# Patient Record
Sex: Female | Born: 1965 | Race: White | Hispanic: No | Marital: Single | Smoking: Former smoker
Health system: Southern US, Community
[De-identification: ages and names within clinical notes are randomized; demographics above are authoritative.]

## PROBLEM LIST (undated history)

## (undated) DIAGNOSIS — F329 Major depressive disorder, single episode, unspecified: Secondary | ICD-10-CM

## (undated) DIAGNOSIS — F32A Depression, unspecified: Secondary | ICD-10-CM

## (undated) DIAGNOSIS — Z720 Tobacco use: Secondary | ICD-10-CM

## (undated) DIAGNOSIS — H1130 Conjunctival hemorrhage, unspecified eye: Secondary | ICD-10-CM

## (undated) DIAGNOSIS — E785 Hyperlipidemia, unspecified: Secondary | ICD-10-CM

## (undated) DIAGNOSIS — R42 Dizziness and giddiness: Secondary | ICD-10-CM

## (undated) DIAGNOSIS — D649 Anemia, unspecified: Secondary | ICD-10-CM

## (undated) DIAGNOSIS — N3281 Overactive bladder: Secondary | ICD-10-CM

## (undated) HISTORY — DX: Hyperlipidemia, unspecified: E78.5

## (undated) HISTORY — DX: Dizziness and giddiness: R42

## (undated) HISTORY — PX: TONSILLECTOMY AND ADENOIDECTOMY: SHX28

## (undated) HISTORY — DX: Overactive bladder: N32.81

## (undated) HISTORY — DX: Anemia, unspecified: D64.9

## (undated) HISTORY — DX: Conjunctival hemorrhage, unspecified eye: H11.30

## (undated) HISTORY — DX: Tobacco use: Z72.0

---

## 1998-12-06 ENCOUNTER — Other Ambulatory Visit: Admission: RE | Admit: 1998-12-06 | Discharge: 1998-12-06 | Payer: Self-pay | Admitting: *Deleted

## 1999-12-26 ENCOUNTER — Other Ambulatory Visit: Admission: RE | Admit: 1999-12-26 | Discharge: 1999-12-26 | Payer: Self-pay | Admitting: *Deleted

## 2001-04-27 ENCOUNTER — Other Ambulatory Visit: Admission: RE | Admit: 2001-04-27 | Discharge: 2001-04-27 | Payer: Self-pay | Admitting: *Deleted

## 2001-05-19 ENCOUNTER — Encounter: Admission: RE | Admit: 2001-05-19 | Discharge: 2001-08-17 | Payer: Self-pay | Admitting: *Deleted

## 2002-05-06 ENCOUNTER — Other Ambulatory Visit: Admission: RE | Admit: 2002-05-06 | Discharge: 2002-05-06 | Payer: Self-pay | Admitting: *Deleted

## 2004-08-04 ENCOUNTER — Emergency Department (HOSPITAL_COMMUNITY): Admission: EM | Admit: 2004-08-04 | Discharge: 2004-08-04 | Payer: Self-pay | Admitting: Emergency Medicine

## 2004-08-05 ENCOUNTER — Emergency Department (HOSPITAL_COMMUNITY): Admission: EM | Admit: 2004-08-05 | Discharge: 2004-08-05 | Payer: Self-pay | Admitting: Emergency Medicine

## 2005-03-08 ENCOUNTER — Ambulatory Visit (HOSPITAL_COMMUNITY): Admission: RE | Admit: 2005-03-08 | Discharge: 2005-03-08 | Payer: Self-pay | Admitting: Family Medicine

## 2005-08-08 ENCOUNTER — Other Ambulatory Visit: Admission: RE | Admit: 2005-08-08 | Discharge: 2005-08-08 | Payer: Self-pay | Admitting: Family Medicine

## 2005-08-22 ENCOUNTER — Ambulatory Visit (HOSPITAL_COMMUNITY): Admission: RE | Admit: 2005-08-22 | Discharge: 2005-08-22 | Payer: Self-pay | Admitting: Family Medicine

## 2007-01-07 ENCOUNTER — Emergency Department (HOSPITAL_COMMUNITY): Admission: EM | Admit: 2007-01-07 | Discharge: 2007-01-07 | Payer: Self-pay | Admitting: *Deleted

## 2008-02-02 ENCOUNTER — Ambulatory Visit (HOSPITAL_COMMUNITY): Admission: RE | Admit: 2008-02-02 | Discharge: 2008-02-02 | Payer: Self-pay | Admitting: Family Medicine

## 2009-08-03 ENCOUNTER — Ambulatory Visit (HOSPITAL_COMMUNITY): Admission: RE | Admit: 2009-08-03 | Discharge: 2009-08-03 | Payer: Self-pay | Admitting: Family Medicine

## 2010-02-12 ENCOUNTER — Encounter: Admission: RE | Admit: 2010-02-12 | Discharge: 2010-02-12 | Payer: Self-pay | Admitting: Family Medicine

## 2010-06-09 ENCOUNTER — Emergency Department (HOSPITAL_COMMUNITY): Admission: EM | Admit: 2010-06-09 | Discharge: 2010-06-09 | Payer: Self-pay | Admitting: Emergency Medicine

## 2010-11-18 ENCOUNTER — Encounter: Payer: Self-pay | Admitting: Family Medicine

## 2011-01-11 LAB — GLUCOSE, CAPILLARY: Glucose-Capillary: 158 mg/dL — ABNORMAL HIGH (ref 70–99)

## 2011-04-01 ENCOUNTER — Ambulatory Visit: Payer: Self-pay

## 2011-05-29 DIAGNOSIS — R12 Heartburn: Secondary | ICD-10-CM | POA: Insufficient documentation

## 2011-05-29 DIAGNOSIS — N318 Other neuromuscular dysfunction of bladder: Secondary | ICD-10-CM | POA: Insufficient documentation

## 2011-05-29 DIAGNOSIS — E7800 Pure hypercholesterolemia, unspecified: Secondary | ICD-10-CM | POA: Insufficient documentation

## 2011-05-29 DIAGNOSIS — R5381 Other malaise: Secondary | ICD-10-CM | POA: Insufficient documentation

## 2011-09-05 ENCOUNTER — Other Ambulatory Visit (HOSPITAL_COMMUNITY): Payer: Self-pay | Admitting: Internal Medicine

## 2011-09-05 DIAGNOSIS — R110 Nausea: Secondary | ICD-10-CM

## 2011-09-05 DIAGNOSIS — R140 Abdominal distension (gaseous): Secondary | ICD-10-CM

## 2011-09-30 ENCOUNTER — Encounter (HOSPITAL_COMMUNITY)
Admission: RE | Admit: 2011-09-30 | Discharge: 2011-09-30 | Disposition: A | Discharge status: Home / Self Care | Payer: PRIVATE HEALTH INSURANCE | Source: Ambulatory Visit | Attending: Internal Medicine | Admitting: Internal Medicine

## 2011-09-30 DIAGNOSIS — R142 Eructation: Secondary | ICD-10-CM | POA: Insufficient documentation

## 2011-09-30 DIAGNOSIS — R140 Abdominal distension (gaseous): Secondary | ICD-10-CM

## 2011-09-30 DIAGNOSIS — R110 Nausea: Secondary | ICD-10-CM | POA: Insufficient documentation

## 2011-09-30 DIAGNOSIS — R141 Gas pain: Secondary | ICD-10-CM | POA: Insufficient documentation

## 2011-09-30 DIAGNOSIS — R109 Unspecified abdominal pain: Secondary | ICD-10-CM | POA: Insufficient documentation

## 2011-09-30 MED ORDER — TECHNETIUM TC 99M MEBROFENIN IV KIT
5.3000 | PACK | Freq: Once | INTRAVENOUS | Status: AC | PRN
  Administered 2011-09-30: 5 via INTRAVENOUS

## 2011-09-30 MED ORDER — SINCALIDE 5 MCG IJ SOLR: 1.8100 ug | Freq: Once | INTRAMUSCULAR | Status: DC

## 2011-10-17 ENCOUNTER — Encounter: Payer: Self-pay | Admitting: Cardiology

## 2011-10-17 ENCOUNTER — Ambulatory Visit (INDEPENDENT_AMBULATORY_CARE_PROVIDER_SITE_OTHER): Payer: PRIVATE HEALTH INSURANCE | Admitting: Cardiology

## 2011-10-17 ENCOUNTER — Encounter: Payer: Self-pay | Admitting: *Deleted

## 2011-10-17 VITALS — BP 110/80 | HR 76 | Ht 62.0 in | Wt 195.0 lb

## 2011-10-17 DIAGNOSIS — E119 Type 2 diabetes mellitus without complications: Secondary | ICD-10-CM

## 2011-10-17 DIAGNOSIS — R42 Dizziness and giddiness: Secondary | ICD-10-CM

## 2011-10-17 DIAGNOSIS — R0989 Other specified symptoms and signs involving the circulatory and respiratory systems: Secondary | ICD-10-CM

## 2011-10-17 DIAGNOSIS — Z720 Tobacco use: Secondary | ICD-10-CM

## 2011-10-17 DIAGNOSIS — F17200 Nicotine dependence, unspecified, uncomplicated: Secondary | ICD-10-CM

## 2011-10-17 DIAGNOSIS — R0600 Dyspnea, unspecified: Secondary | ICD-10-CM

## 2011-10-17 DIAGNOSIS — R0789 Other chest pain: Secondary | ICD-10-CM

## 2011-10-17 DIAGNOSIS — E785 Hyperlipidemia, unspecified: Secondary | ICD-10-CM

## 2011-10-17 NOTE — Patient Instructions (Signed)
We will schedule you for a stress Echo  We will have you wear an event monitor.

## 2011-10-18 DIAGNOSIS — R42 Dizziness and giddiness: Secondary | ICD-10-CM | POA: Insufficient documentation

## 2011-10-18 DIAGNOSIS — E785 Hyperlipidemia, unspecified: Secondary | ICD-10-CM | POA: Insufficient documentation

## 2011-10-18 DIAGNOSIS — R0789 Other chest pain: Secondary | ICD-10-CM | POA: Insufficient documentation

## 2011-10-18 DIAGNOSIS — E119 Type 2 diabetes mellitus without complications: Secondary | ICD-10-CM | POA: Insufficient documentation

## 2011-10-18 DIAGNOSIS — Z720 Tobacco use: Secondary | ICD-10-CM | POA: Insufficient documentation

## 2011-10-18 NOTE — Progress Notes (Signed)
Michelle Rasmussen Date of Birth: [birth year] Medical Record #[id number]  History of Present Illness: Michelle Rasmussen is seen at the request of Loree Fee PA-C for cardiac evaluation. She is a 45 year old white female who has a history of hypertension, hyperlipidemia, tobacco abuse, and family history of coronary disease. She has been experiencing a number of unusual symptoms. She complains that she has a lot of gas in her chest that radiates up into her neck and into her temples. She feels sick on her stomach a lot. Over the past 2 years she's been experiencing dizzy spells. These spells only happen at work. 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 Sometimes this progresses to the point where her head is actually jerking and she feels as though she is trembling all over. On a couple of occasions she felt she might pass out. She has had no increase in shortness of breath. She has no true chest pain. She has had an abdominal ultrasound which showed normal gallbladder anatomy.  No current outpatient prescriptions on file prior to visit.    Allergies  Allergen Reactions  . Bupropion     Past Medical History  Diagnosis Date  . Diabetes mellitus   . Hyperlipidemia   . OAB (overactive bladder)   . 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 Dizzy spells     x2 years  . Tobacco abuse     Past Surgical History  Procedure Date  . Tonsillectomy and adenoidectomy     History  Smoking status  . Current Everyday Smoker -- 1.0 packs/day for 22 years  . Types: Cigarettes  Smokeless tobacco  . Not on file    History  Alcohol Use No    Family History  Problem Relation Age of Onset  . COPD Mother   . Hypertension Mother   . Diabetes Mother   . Schizophrenia Mother   . Heart attack Father   . ALS Father   . Coronary artery disease Father     with CABG x7    Review of  Systems: The review of systems is positive for occasional episodes of burning discomfort down both legs from the hips down. She complains of blurred vision. She notes that her memory is getting worse.  All other systems were reviewed and are negative.  Physical Exam: BP 110/80  Pulse 76  Ht 5\' 2"  (1.575 m)  Wt 195 lb (88.451 kg)  BMI 35.67 kg/m2  LMP 09/30/2011 She is an obese white female in no acute distress.The patient is alert and oriented x 3.  The mood and affect are normal.  The skin is warm and dry.  Color is normal.  The HEENT exam reveals that the sclera are nonicteric.  The mucous membranes are moist.  The carotids are 2+ without bruits.  There is no thyromegaly.  There is no JVD.  The lungs are clear.  The chest wall is non tender.  The heart exam reveals a regular rate with a normal S1 and S2.  There are no murmurs, gallops, or rubs.  The PMI is not displaced.   Abdominal exam reveals good bowel sounds.  There is no guarding or rebound.  There is no hepatosplenomegaly or tenderness.  There are no masses.  Exam of the legs reveal no clubbing, cyanosis, or edema.  The legs are without rashes.  The distal pulses are intact.  Cranial nerves II - XII are intact.  Motor and sensory functions are intact.  The gait is normal.  LABORATORY DATA: ECG today is normal. Recent blood work revealed a hemoglobin of 10.8. Glucose is 234. Other chemistries were normal. TSH was normal. A1c was 7.4%. Total cholesterol 137, triglycerides 303, HDL 28, LDL 48.  Assessment / Plan:

## 2011-10-18 NOTE — Assessment & Plan Note (Signed)
Symptoms are atypical for cardiac arrhythmia or vascular etiology. Her symptoms are more positional when she raises her arms above her head. We will have her wear an event monitor to rule out any significant arrhythmia.

## 2011-10-18 NOTE — Assessment & Plan Note (Signed)
Patient was counseled on the importance of smoking cessation. She's not ready to quit at this time.

## 2011-10-18 NOTE — Assessment & Plan Note (Signed)
Review of systems is diffusely positive. Her symptoms are atypical for cardiac disease but she does have multiple cardiac risk factors including history of diabetes, hyperlipidemia, tobacco abuse, and family history of early coronary disease. I would recommend a stress echo to further stratify her cardiac risk. If this study and her event monitor on unremarkable and I think we can safely say that her symptoms were not cardiac related.

## 2011-10-31 ENCOUNTER — Telehealth: Payer: Self-pay | Admitting: Cardiology

## 2011-10-31 NOTE — Telephone Encounter (Signed)
LOV,12 lead faxed to [facility] @ [phone] 10/30/10/KM

## 2011-11-01 ENCOUNTER — Other Ambulatory Visit (HOSPITAL_COMMUNITY): Payer: Self-pay | Admitting: Cardiology

## 2011-11-01 ENCOUNTER — Ambulatory Visit (HOSPITAL_BASED_OUTPATIENT_CLINIC_OR_DEPARTMENT_OTHER): Payer: PRIVATE HEALTH INSURANCE | Admitting: Radiology

## 2011-11-01 ENCOUNTER — Ambulatory Visit (HOSPITAL_COMMUNITY): Payer: PRIVATE HEALTH INSURANCE | Attending: Internal Medicine | Admitting: Radiology

## 2011-11-01 DIAGNOSIS — I10 Essential (primary) hypertension: Secondary | ICD-10-CM | POA: Insufficient documentation

## 2011-11-01 DIAGNOSIS — R0600 Dyspnea, unspecified: Secondary | ICD-10-CM

## 2011-11-01 DIAGNOSIS — E785 Hyperlipidemia, unspecified: Secondary | ICD-10-CM | POA: Insufficient documentation

## 2011-11-01 DIAGNOSIS — R55 Syncope and collapse: Secondary | ICD-10-CM | POA: Insufficient documentation

## 2011-11-01 DIAGNOSIS — Z8249 Family history of ischemic heart disease and other diseases of the circulatory system: Secondary | ICD-10-CM | POA: Insufficient documentation

## 2011-11-01 DIAGNOSIS — R0989 Other specified symptoms and signs involving the circulatory and respiratory systems: Secondary | ICD-10-CM

## 2011-11-01 DIAGNOSIS — R072 Precordial pain: Secondary | ICD-10-CM | POA: Insufficient documentation

## 2011-11-01 DIAGNOSIS — F17200 Nicotine dependence, unspecified, uncomplicated: Secondary | ICD-10-CM | POA: Insufficient documentation

## 2011-11-01 MED ORDER — PERFLUTREN PROTEIN A MICROSPH IV SUSP
2.5000 mL | Freq: Once | INTRAVENOUS | Status: AC
  Administered 2011-11-01: 2.5 mL via INTRAVENOUS

## 2012-02-21 ENCOUNTER — Encounter: Payer: Self-pay | Admitting: Cardiology

## 2012-04-02 ENCOUNTER — Ambulatory Visit (HOSPITAL_COMMUNITY)
Admission: RE | Admit: 2012-04-02 | Discharge: 2012-04-02 | Disposition: A | Discharge status: Home / Self Care | Payer: PRIVATE HEALTH INSURANCE | Source: Ambulatory Visit | Attending: Internal Medicine | Admitting: Internal Medicine

## 2012-04-02 ENCOUNTER — Other Ambulatory Visit (HOSPITAL_COMMUNITY): Payer: Self-pay | Admitting: Internal Medicine

## 2012-04-02 DIAGNOSIS — R059 Cough, unspecified: Secondary | ICD-10-CM | POA: Insufficient documentation

## 2012-04-02 DIAGNOSIS — R5381 Other malaise: Secondary | ICD-10-CM | POA: Insufficient documentation

## 2012-04-02 DIAGNOSIS — R05 Cough: Secondary | ICD-10-CM | POA: Insufficient documentation

## 2012-04-14 ENCOUNTER — Other Ambulatory Visit: Payer: Self-pay | Admitting: Internal Medicine

## 2012-04-14 DIAGNOSIS — R109 Unspecified abdominal pain: Secondary | ICD-10-CM

## 2012-04-15 ENCOUNTER — Encounter: Payer: Self-pay | Admitting: Gastroenterology

## 2012-04-15 ENCOUNTER — Ambulatory Visit (INDEPENDENT_AMBULATORY_CARE_PROVIDER_SITE_OTHER): Payer: PRIVATE HEALTH INSURANCE | Admitting: Gastroenterology

## 2012-04-15 ENCOUNTER — Other Ambulatory Visit: Payer: PRIVATE HEALTH INSURANCE

## 2012-04-15 ENCOUNTER — Ambulatory Visit
Admission: RE | Admit: 2012-04-15 | Discharge: 2012-04-15 | Disposition: A | Discharge status: Home / Self Care | Payer: PRIVATE HEALTH INSURANCE | Source: Ambulatory Visit | Attending: Internal Medicine | Admitting: Internal Medicine

## 2012-04-15 VITALS — BP 120/78 | HR 92 | Ht 62.0 in | Wt 186.0 lb

## 2012-04-15 DIAGNOSIS — R197 Diarrhea, unspecified: Secondary | ICD-10-CM

## 2012-04-15 DIAGNOSIS — D509 Iron deficiency anemia, unspecified: Secondary | ICD-10-CM

## 2012-04-15 DIAGNOSIS — K219 Gastro-esophageal reflux disease without esophagitis: Secondary | ICD-10-CM

## 2012-04-15 DIAGNOSIS — R109 Unspecified abdominal pain: Secondary | ICD-10-CM

## 2012-04-15 DIAGNOSIS — R110 Nausea: Secondary | ICD-10-CM

## 2012-04-15 DIAGNOSIS — R112 Nausea with vomiting, unspecified: Secondary | ICD-10-CM

## 2012-04-15 MED ORDER — IOHEXOL 300 MG/ML  SOLN
100.0000 mL | Freq: Once | INTRAMUSCULAR | Status: AC | PRN
  Administered 2012-04-15: 100 mL via INTRAVENOUS

## 2012-04-15 MED ORDER — MOVIPREP 100 G PO SOLR: 1.0000 | Freq: Once | ORAL | Status: DC

## 2012-04-15 MED ORDER — OMEPRAZOLE 40 MG PO CPDR: 40.0000 mg | DELAYED_RELEASE_CAPSULE | Freq: Two times a day (BID) | ORAL | Status: DC

## 2012-04-15 NOTE — Patient Instructions (Addendum)
Your physician has requested that you go to the basement for the following lab work before leaving today:Celiac Panel. You have been scheduled for an endoscopy and colonoscopy with propofol. Please follow the written instructions given to you at your visit today. Please pick up your prep at the pharmacy within the next 1-3 days. Increase your omeprazole to twice daily. A new prescription has been sent to your pharmacy.  Follow instructions on Hemoccult cards and mail them back to [place] when finished. Patient advised to avoid spicy, acidic, citrus, chocolate, mints, fruit and fruit juices.  Limit the intake of caffeine, alcohol and Soda.  Don't exercise too soon after eating.  Don't lie down within 3-4 hours of eating.  Elevate the head of your bed. Fill your Hyoscamine prescription and take as directed. Cc: Lucky Cowboy, MD

## 2012-04-15 NOTE — Progress Notes (Signed)
History of Present Illness: This is a 46 year old female with diarrhea, nausea, vomiting and lower abdominal pain. She has a long history of GERD treated with daily omeprazole. Recently she has had frequent breakthrough symptoms. She has problems with nausea and vomiting about once every month for many years. CCK HIDA done December 2012 was negative. An abdominal pelvic CT scan is scheduled later today. She relates problems with intermittent urgent diarrhea over the past several years but for the past 2-3 months her symptoms have been more frequent and more severe with diarrhea occurring 6-8 times some days. She was recently found to be iron deficient with a hemoglobin of 10.8 and iron saturation 4% and iron therapy was started. Her hemoglobin has increased to 12.4. Denies weight loss, constipation, change in stool caliber, melena, hematochezia, nausea, vomiting, dysphagia, chest pain.  Review of Systems: Pertinent positive and negative review of systems were noted in the above HPI section. All other review of systems were otherwise negative.  Current Medications, Allergies, Past Medical History, Past Surgical History, Family History and Social History were reviewed in [employer] record.  Physical Exam: General: Well developed , well nourished, no acute distress Head: Normocephalic and atraumatic Eyes:  sclerae anicteric, EOMI Ears: Normal auditory acuity Mouth: No deformity or lesions Neck: Supple, no masses or thyromegaly Lungs: Clear throughout to auscultation Heart: Regular rate and rhythm; no murmurs, rubs or bruits Abdomen: Soft, non tender and non distended. No masses, hepatosplenomegaly or hernias noted. Normal Bowel sounds Rectal: deferred to colonoscopy Musculoskeletal: Symmetrical with no gross deformities  Skin: No lesions on visible extremities Pulses:  Normal pulses noted Extremities: No clubbing, cyanosis, edema or deformities noted Neurological: Alert  oriented x 4, grossly nonfocal Cervical Nodes:  No significant cervical adenopathy Inguinal Nodes: No significant inguinal adenopathy Psychological:  Alert and cooperative. Normal mood and affect  Assessment and Recommendations:  1. GERD with frequent breakthrough reflux symptoms and episodic nausea and vomiting. Intensify antireflux measures and increase omeprazole to 40 mg twice daily. Schedule upper endoscopy for further evaluation. The risks, benefits, and alternatives to endoscopy with possible biopsy and possible dilation were discussed with the patient and they consent to proceed.   2. Iron deficiency anemia. Rule out occult gastrointestinal losses. Obtain stool Hemoccults and a celiac panel. Schedule colonoscopy and upper endoscopy as above. The risks, benefits, and alternatives to colonoscopy with possible biopsy and possible polypectomy were discussed with the patient and they consent to proceed.   3. Worsening diarrhea and lower abdominal pain. Celiac panel and Hemoccults as above. Abdominal/pelvic CT scan as scheduled.Colonoscopy as above. She is advised to begin hyoscyamine as prescribed.

## 2012-04-16 ENCOUNTER — Telehealth: Payer: Self-pay

## 2012-04-16 LAB — CELIAC PANEL 10
Endomysial Screen: NEGATIVE
Gliadin IgA: 2.2 U/mL (ref <20)
Gliadin IgG: 19.8 U/mL (ref <20)
IgA: 51 mg/dL — ABNORMAL LOW (ref 69–380)
Tissue Transglutaminase Ab, IgA: 1.5 U/mL (ref <20)

## 2012-04-16 NOTE — Telephone Encounter (Signed)
Spoke with scheduler and asked if she would either leave a message or have nurse call me back regarding a CT scan they faxed to [place] this morning stating that Dr. Russella Dar to f/u with all CT scans. Dr. Russella Dar reviewed the CT scan and his recommendation is that Dr. 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 Informed scheduler that Dr. Russella Dar did not order the CT scan and he would like her Primary Care Physician that ordered the scan to due to referral for her. Scheduler states she will send this message on to the nurse and if they have any questions they will call back.

## 2012-05-08 ENCOUNTER — Other Ambulatory Visit: Payer: PRIVATE HEALTH INSURANCE

## 2012-05-08 DIAGNOSIS — R110 Nausea: Secondary | ICD-10-CM

## 2012-05-08 DIAGNOSIS — R197 Diarrhea, unspecified: Secondary | ICD-10-CM

## 2012-05-08 LAB — HEMOCCULT SLIDES (X 3 CARDS)
Fecal Occult Blood: NEGATIVE
OCCULT 3: NEGATIVE

## 2012-05-26 ENCOUNTER — Telehealth: Payer: Self-pay | Admitting: Gastroenterology

## 2012-05-26 ENCOUNTER — Encounter: Payer: Self-pay | Admitting: Gastroenterology

## 2012-05-26 ENCOUNTER — Ambulatory Visit (AMBULATORY_SURGERY_CENTER): Payer: PRIVATE HEALTH INSURANCE | Admitting: Gastroenterology

## 2012-05-26 ENCOUNTER — Telehealth: Payer: Self-pay

## 2012-05-26 VITALS — BP 121/77 | HR 76 | Temp 98.6°F | Resp 13 | Ht 62.0 in | Wt 186.0 lb

## 2012-05-26 DIAGNOSIS — D126 Benign neoplasm of colon, unspecified: Secondary | ICD-10-CM

## 2012-05-26 DIAGNOSIS — R197 Diarrhea, unspecified: Secondary | ICD-10-CM

## 2012-05-26 DIAGNOSIS — K219 Gastro-esophageal reflux disease without esophagitis: Secondary | ICD-10-CM

## 2012-05-26 DIAGNOSIS — R110 Nausea: Secondary | ICD-10-CM

## 2012-05-26 DIAGNOSIS — D1330 Benign neoplasm of unspecified part of small intestine: Secondary | ICD-10-CM

## 2012-05-26 DIAGNOSIS — R109 Unspecified abdominal pain: Secondary | ICD-10-CM

## 2012-05-26 LAB — GLUCOSE, CAPILLARY
Glucose-Capillary: 142 mg/dL — ABNORMAL HIGH (ref 70–99)
Glucose-Capillary: 129 mg/dL — ABNORMAL HIGH (ref 70–99)

## 2012-05-26 MED ORDER — SODIUM CHLORIDE 0.9 % IV SOLN: 500.0000 mL | INTRAVENOUS | Status: DC

## 2012-05-26 NOTE — Op Note (Signed)
[facility] [address] [place], [place]  [address]  ENDOSCOPY PROCEDURE REPORT PATIENT:  Michelle Rasmussen, Michelle Rasmussen  MR#:  [id number] BIRTHDATE:  04-Feb-1966, 45 yrs. old  GENDER:  female ENDOSCOPIST:  Judie Petit T. Russella Dar, MD, [facility] Referred by:  Lucky Cowboy, M.D. PROCEDURE DATE:  05/26/2012 PROCEDURE:  EGD with biopsy, 43239 ASA CLASS:  Class II INDICATIONS:  GERD, nausea, diarrhea MEDICATIONS:  There was residual sedation effect present from prior procedure, MAC sedation, administered by CRNA, propofol 150 mg IV, glycopyrrolate 0.2 mg IV TOPICAL ANESTHETIC:  none DESCRIPTION OF PROCEDURE:   After the risks benefits and alternatives of the procedure were thoroughly explained, informed consent was obtained.  The LB GIF-H180 [id number] endoscope was introduced through the mouth and advanced to the second portion of the duodenum, without limitations.  The instrument was slowly withdrawn as the mucosa was fully examined. <<PROCEDUREIMAGES>> The esophagus and gastroesophageal junction were completely normal in appearance.  The stomach was entered and closely examined. The pylorus, antrum, angularis, and lesser curvature were well visualized, including a retroflexed view of the cardia and fundus. The stomach wall was normally distensable. The scope passed easily through the pylorus into the duodenum.  The duodenal bulb was normal in appearance, as was the postbulbar duodenum. Random biopsies were obtained and sent to pathology.  Retroflexed views revealed no abnormalities.  The scope was then withdrawn from the patient and the procedure completed.  COMPLICATIONS:  None  ENDOSCOPIC IMPRESSION: 1) Normal EGD  RECOMMENDATIONS: 1) Anti-reflux regimen 2) Await pathology results 3) Continue PPI  Neftaly Swiss T. Russella Dar, MD, [facility]  n. eSIGNED:   Venita Lick. Thomasa Heidler at 05/26/2012 02:39 PM  [name], [phone]

## 2012-05-26 NOTE — Op Note (Signed)
[facility] [address] [place], [place]  [address]  COLONOSCOPY PROCEDURE REPORT  PATIENT:  Michelle Rasmussen, Michelle Rasmussen  MR#:  [id number] BIRTHDATE:  Nov 14, 1965, 45 yrs. old  GENDER:  female ENDOSCOPIST:  Judie Petit T. Russella Dar, MD, [facility] Referred by:  Lucky Cowboy, M.D. PROCEDURE DATE:  05/26/2012 PROCEDURE:  Colonoscopy with biopsy and snare polypectomy ASA CLASS:  Class II INDICATIONS:  1) unexplained diarrhea  2) abdominal pain MEDICATIONS:   MAC sedation, administered by CRNA, propofol (Diprivan) 200 mg IV DESCRIPTION OF PROCEDURE:   After the risks benefits and alternatives of the procedure were thoroughly explained, informed consent was obtained.  Digital rectal exam was performed and revealed no abnormalities.   The LB CF-H180AL [id number] endoscope was introduced through the anus and advanced to the terminal ileum which was intubated for a short distance, without limitations. The quality of the prep was good, using MoviPrep.  The instrument was then slowly withdrawn as the colon was fully examined. <<PROCEDUREIMAGES>> FINDINGS:  A sessile polyp was found in the descending colon. It was 6 mm in size. Polyp was snared without cautery. Retrieval was successful.  Otherwise normal colonoscopy without other polyps, masses, vascular ectasias, or inflammatory changes. Random biopsies were obtained and sent to pathology.  The terminal ileum appeared normal.   Retroflexed views in the rectum revealed no abnormalities.  The time to cecum =  2  minutes. The scope was then withdrawn (time =  11.5  min) from the patient and the procedure completed.  COMPLICATIONS:  None  ENDOSCOPIC IMPRESSION: 1) 6 mm sessile polyp in the descending colon 2) Normal terminal ileum  RECOMMENDATIONS: 1) Await pathology results 2) Repeat Colonoscopy in 5 years if polyp is adenomatous, otherwise 10 years for routine screening. 3) Continue antispasmotics as needed  Jacub Waiters T. Russella Dar, MD,  [facility]  n. eSIGNED:   Venita Lick. Ellisyn Icenhower at 05/26/2012 02:32 PM  [name], [phone]

## 2012-05-26 NOTE — Progress Notes (Signed)
Patient did not experience any of the following events: a burn prior to discharge; a fall within the facility; wrong site/side/patient/procedure/implant event; or a hospital transfer or hospital admission upon discharge from the facility. (G8907) Patient did not have preoperative order for IV antibiotic SSI prophylaxis. (G8918)  

## 2012-05-26 NOTE — Telephone Encounter (Signed)
Drank all of 1st dose of Moviprep yest pm.  Drank 1/2 of Moviprep today.  Plans to keep drinking slowly and hopefully finish.  Advised to also do 2 enemas 30 minutes apart if not cleaned out.

## 2012-05-26 NOTE — Patient Instructions (Addendum)
YOU HAD AN ENDOSCOPIC PROCEDURE TODAY AT THE [facility]: Refer to the procedure report that was given to you for any specific questions about what was found during the examination.  If the procedure report does not answer your questions, please call your gastroenterologist to clarify.  If you requested that your care partner not be given the details of your procedure findings, then the procedure report has been included in a sealed envelope for you to review at your convenience later.  YOU SHOULD EXPECT: Some feelings of bloating in the abdomen. Passage of more gas than usual.  Walking can help get rid of the air that was put into your GI tract during the procedure and reduce the bloating. If you had a lower endoscopy (such as a colonoscopy or flexible sigmoidoscopy) you may notice spotting of blood in your stool or on the toilet paper. If you underwent a bowel prep for your procedure, then you may not have a normal bowel movement for a few days.  DIET: Your first meal following the procedure should be a light meal and then it is ok to progress to your normal diet.  A half-sandwich or bowl of soup is an example of a good first meal.  Heavy or fried foods are harder to digest and may make you feel nauseous or bloated.  Likewise meals heavy in dairy and vegetables can cause extra gas to form and this can also increase the bloating.  Drink plenty of fluids but you should avoid alcoholic beverages for 24 hours.  ACTIVITY: Your care partner should take you home directly after the procedure.  You should plan to take it easy, moving slowly for the rest of the day.  You can resume normal activity the day after the procedure however you should NOT DRIVE or use heavy machinery for 24 hours (because of the sedation medicines used during the test).    SYMPTOMS TO REPORT IMMEDIATELY: A gastroenterologist can be reached at any hour.  During normal business hours, 8:30 AM to 5:00 PM Monday through Friday,  call [phone].  After hours and on weekends, please call the GI answering service at [phone] who will take a message and have the physician on call contact you.   Following lower endoscopy (colonoscopy or flexible sigmoidoscopy):  Excessive amounts of blood in the stool  Significant tenderness or worsening of abdominal pains  Swelling of the abdomen that is new, acute  Fever of 100F or higher  Following upper endoscopy (EGD)  Vomiting of blood or coffee ground material  New chest pain or pain under the shoulder blades  Painful or persistently difficult swallowing  New shortness of breath  Fever of 100F or higher  Black, tarry-looking stools  FOLLOW UP: If any biopsies were taken you will be contacted by phone or by letter within the next 1-3 weeks.  Call your gastroenterologist if you have not heard about the biopsies in 3 weeks.  Our staff will call the home number listed on your records the next business day following your procedure to check on you and address any questions or concerns that you may have at that time regarding the information given to you following your procedure. This is a courtesy call and so if there is no answer at the home number and we have not heard from you through the emergency physician on call, we will assume that you have returned to your regular daily activities without incident.  SIGNATURES/CONFIDENTIALITY: You and/or your care   partner have signed paperwork which will be entered into your electronic medical record.  These signatures attest to the fact that that the information above on your After Visit Summary has been reviewed and is understood.  Full responsibility of the confidentiality of this discharge information lies with you and/or your care-partner.   Resume medications. Information given on polyps with discharge instructions. 

## 2012-05-27 ENCOUNTER — Telehealth: Payer: Self-pay | Admitting: *Deleted

## 2012-05-27 NOTE — Telephone Encounter (Signed)
  Follow up Call-  Call back number 05/26/2012  Post procedure Call Back phone  # [phone]  Permission to leave phone message Yes     Patient questions:  Do you have a fever, pain , or abdominal swelling? no Pain Score  0 *  Have you tolerated food without any problems? yes  Have you been able to return to your normal activities? yes  Do you have any questions about your discharge instructions: Diet   no Medications  no Follow up visit  no  Do you have questions or concerns about your Care? no  Actions: * If pain score is 4 or above: No action needed, pain <4.

## 2012-06-01 ENCOUNTER — Encounter: Payer: Self-pay | Admitting: Gastroenterology

## 2012-06-11 NOTE — Telephone Encounter (Signed)
See previous message

## 2012-09-30 ENCOUNTER — Other Ambulatory Visit: Payer: Self-pay | Admitting: Internal Medicine

## 2012-09-30 DIAGNOSIS — E278 Other specified disorders of adrenal gland: Secondary | ICD-10-CM

## 2012-10-14 ENCOUNTER — Other Ambulatory Visit: Payer: PRIVATE HEALTH INSURANCE

## 2012-11-11 ENCOUNTER — Other Ambulatory Visit: Payer: Self-pay | Admitting: Gastroenterology

## 2012-11-11 ENCOUNTER — Other Ambulatory Visit (HOSPITAL_COMMUNITY): Payer: Self-pay | Admitting: Gastroenterology

## 2012-11-11 DIAGNOSIS — R110 Nausea: Secondary | ICD-10-CM

## 2012-11-11 DIAGNOSIS — R112 Nausea with vomiting, unspecified: Secondary | ICD-10-CM

## 2012-11-17 ENCOUNTER — Ambulatory Visit
Admission: RE | Admit: 2012-11-17 | Discharge: 2012-11-17 | Disposition: A | Discharge status: Home / Self Care | Payer: PRIVATE HEALTH INSURANCE | Source: Ambulatory Visit | Attending: Gastroenterology | Admitting: Gastroenterology

## 2012-11-17 DIAGNOSIS — R112 Nausea with vomiting, unspecified: Secondary | ICD-10-CM

## 2012-11-18 ENCOUNTER — Encounter (HOSPITAL_COMMUNITY)
Admission: RE | Admit: 2012-11-18 | Discharge: 2012-11-18 | Disposition: A | Discharge status: Home / Self Care | Payer: PRIVATE HEALTH INSURANCE | Source: Ambulatory Visit | Attending: Gastroenterology | Admitting: Gastroenterology

## 2012-11-18 ENCOUNTER — Encounter (HOSPITAL_COMMUNITY): Payer: Self-pay

## 2012-11-18 DIAGNOSIS — R110 Nausea: Secondary | ICD-10-CM

## 2012-11-18 MED ORDER — TECHNETIUM TC 99M SULFUR COLLOID
2.2000 | Freq: Once | INTRAVENOUS | Status: AC | PRN
  Administered 2012-11-18: 2.2 via ORAL

## 2012-12-29 ENCOUNTER — Encounter (HOSPITAL_COMMUNITY): Payer: Self-pay | Admitting: *Deleted

## 2012-12-29 ENCOUNTER — Emergency Department (HOSPITAL_COMMUNITY)
Admission: EM | Admit: 2012-12-29 | Discharge: 2012-12-29 | Disposition: A | Discharge status: Home / Self Care | Payer: PRIVATE HEALTH INSURANCE | Attending: Emergency Medicine | Admitting: Emergency Medicine

## 2012-12-29 DIAGNOSIS — N318 Other neuromuscular dysfunction of bladder: Secondary | ICD-10-CM | POA: Insufficient documentation

## 2012-12-29 DIAGNOSIS — E119 Type 2 diabetes mellitus without complications: Secondary | ICD-10-CM | POA: Insufficient documentation

## 2012-12-29 DIAGNOSIS — R112 Nausea with vomiting, unspecified: Secondary | ICD-10-CM

## 2012-12-29 DIAGNOSIS — Z794 Long term (current) use of insulin: Secondary | ICD-10-CM | POA: Insufficient documentation

## 2012-12-29 DIAGNOSIS — F17200 Nicotine dependence, unspecified, uncomplicated: Secondary | ICD-10-CM | POA: Insufficient documentation

## 2012-12-29 DIAGNOSIS — R109 Unspecified abdominal pain: Secondary | ICD-10-CM | POA: Insufficient documentation

## 2012-12-29 DIAGNOSIS — F3289 Other specified depressive episodes: Secondary | ICD-10-CM | POA: Insufficient documentation

## 2012-12-29 DIAGNOSIS — Z79899 Other long term (current) drug therapy: Secondary | ICD-10-CM | POA: Insufficient documentation

## 2012-12-29 DIAGNOSIS — R197 Diarrhea, unspecified: Secondary | ICD-10-CM | POA: Insufficient documentation

## 2012-12-29 DIAGNOSIS — Z8669 Personal history of other diseases of the nervous system and sense organs: Secondary | ICD-10-CM | POA: Insufficient documentation

## 2012-12-29 DIAGNOSIS — Z7982 Long term (current) use of aspirin: Secondary | ICD-10-CM | POA: Insufficient documentation

## 2012-12-29 DIAGNOSIS — Z862 Personal history of diseases of the blood and blood-forming organs and certain disorders involving the immune mechanism: Secondary | ICD-10-CM | POA: Insufficient documentation

## 2012-12-29 DIAGNOSIS — D649 Anemia, unspecified: Secondary | ICD-10-CM | POA: Insufficient documentation

## 2012-12-29 DIAGNOSIS — F329 Major depressive disorder, single episode, unspecified: Secondary | ICD-10-CM | POA: Insufficient documentation

## 2012-12-29 DIAGNOSIS — Z8639 Personal history of other endocrine, nutritional and metabolic disease: Secondary | ICD-10-CM | POA: Insufficient documentation

## 2012-12-29 HISTORY — DX: Depression, unspecified: F32.A

## 2012-12-29 HISTORY — DX: Major depressive disorder, single episode, unspecified: F32.9

## 2012-12-29 LAB — CBC WITH DIFFERENTIAL/PLATELET
Basophils Relative: 0 % (ref 0–1)
Hemoglobin: 14 g/dL (ref 12.0–15.0)
Lymphocytes Relative: 12 % (ref 12–46)
Lymphs Abs: 1.8 10*3/uL (ref 0.7–4.0)
Monocytes Relative: 4 % (ref 3–12)
Neutro Abs: 12.3 10*3/uL — ABNORMAL HIGH (ref 1.7–7.7)
Neutrophils Relative %: 82 % — ABNORMAL HIGH (ref 43–77)
RBC: 4.72 MIL/uL (ref 3.87–5.11)

## 2012-12-29 LAB — URINALYSIS, ROUTINE W REFLEX MICROSCOPIC
Glucose, UA: 100 mg/dL — AB
Leukocytes, UA: NEGATIVE
Protein, ur: NEGATIVE mg/dL
pH: 5 (ref 5.0–8.0)

## 2012-12-29 LAB — COMPREHENSIVE METABOLIC PANEL
ALT: 9 U/L (ref 0–35)
AST: 11 U/L (ref 0–37)
Albumin: 3.7 g/dL (ref 3.5–5.2)
Alkaline Phosphatase: 76 U/L (ref 39–117)
BUN: 9 mg/dL (ref 6–23)
Chloride: 99 mEq/L (ref 96–112)
Potassium: 4.3 mEq/L (ref 3.5–5.1)
Sodium: 135 mEq/L (ref 135–145)
Total Bilirubin: 0.2 mg/dL — ABNORMAL LOW (ref 0.3–1.2)
Total Protein: 7.6 g/dL (ref 6.0–8.3)

## 2012-12-29 LAB — URINE MICROSCOPIC-ADD ON

## 2012-12-29 LAB — PREGNANCY, URINE: Preg Test, Ur: NEGATIVE

## 2012-12-29 MED ORDER — METOCLOPRAMIDE HCL 5 MG/ML IJ SOLN
10.0000 mg | Freq: Once | INTRAMUSCULAR | Status: AC
  Administered 2012-12-29: 10 mg via INTRAVENOUS
  Filled 2012-12-29: qty 2

## 2012-12-29 MED ORDER — SODIUM CHLORIDE 0.9 % IV BOLUS (SEPSIS)
500.0000 mL | Freq: Once | INTRAVENOUS | Status: AC
  Administered 2012-12-29: 500 mL via INTRAVENOUS

## 2012-12-29 MED ORDER — OXYCODONE-ACETAMINOPHEN 5-325 MG PO TABS
1.0000 | ORAL_TABLET | Freq: Four times a day (QID) | ORAL | Status: DC | PRN
Start: 2012-12-29 — End: 2013-09-02

## 2012-12-29 MED ORDER — ONDANSETRON 8 MG PO TBDP: 8.0000 mg | ORAL_TABLET | Freq: Three times a day (TID) | ORAL | Status: DC | PRN

## 2012-12-29 MED ORDER — MORPHINE SULFATE 4 MG/ML IJ SOLN
4.0000 mg | Freq: Once | INTRAMUSCULAR | Status: AC
  Administered 2012-12-29: 4 mg via INTRAVENOUS
  Filled 2012-12-29: qty 1

## 2012-12-29 MED ORDER — METOCLOPRAMIDE HCL 10 MG PO TABS: 10.0000 mg | ORAL_TABLET | Freq: Four times a day (QID) | ORAL | Status: DC | PRN

## 2012-12-29 MED ORDER — ONDANSETRON HCL 4 MG/2ML IJ SOLN
4.0000 mg | Freq: Once | INTRAMUSCULAR | Status: AC
  Administered 2012-12-29: 4 mg via INTRAVENOUS
  Filled 2012-12-29: qty 2

## 2012-12-29 NOTE — ED Notes (Addendum)
Up to b/r d/t necessity. Attempting urine & stool sample. Pt vomiting at this time. RN & EMT updated. Family remains at [facility] in room.

## 2012-12-29 NOTE — ED Provider Notes (Signed)
History     CSN: [id number]  Arrival date & time 12/29/12  0059   First MD Initiated Contact with Patient 12/29/12 0102      Chief Complaint  Patient presents with  . Abdominal Pain  . Nausea  . Emesis  . Diarrhea    (Consider location/radiation/quality/duration/timing/severity/associated sxs/prior treatment) Patient is a 47 y.o. female presenting with abdominal pain, vomiting, and diarrhea.  Abdominal Pain Associated symptoms: diarrhea and vomiting   Associated symptoms: no chest pain, no nausea and no shortness of breath   Emesis Associated symptoms: abdominal pain and diarrhea   Associated symptoms: no headaches   Diarrhea Associated symptoms: abdominal pain and vomiting   Associated symptoms: no headaches   patient presents with abdominal pain nausea vomiting diarrhea. It began this evening. She states it began as menstrual cramps and now involves her lower abdomen. No fevers. No cough. She's a history of abdominal problems but states it does not usually feel like this. No fevers. No clear sick contacts.she has had a decreased appetite  Past Medical History  Diagnosis Date  . Diabetes mellitus   . 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 Dizzy spells     x2 years  . Tobacco abuse   . Anemia   . OAB (overactive bladder)   . Hyperlipidemia   . Anemia   . Depression     Past Surgical History  Procedure Laterality Date  . Tonsillectomy and adenoidectomy      Family History  Problem Relation Age of Onset  . COPD Mother   . Hypertension Mother   . Diabetes Mother   . Schizophrenia Mother   . Heart attack Father   . ALS Father   . Coronary artery disease Father     with CABG x7  . Heart disease Father     History  Substance Use Topics  . Smoking status: Current Every Day Smoker -- 1.00 packs/day for 22 years    Types: Cigarettes  . Smokeless tobacco: Never Used  . Alcohol Use: No    OB History   Grav Para Term Preterm Abortions TAB SAB Ect Mult  Living                  Review of Systems  Constitutional: Negative for activity change and appetite change.  HENT: Negative for neck stiffness.   Eyes: Negative for pain.  Respiratory: Negative for chest tightness and shortness of breath.   Cardiovascular: Negative for chest pain and leg swelling.  Gastrointestinal: Positive for vomiting, abdominal pain and diarrhea. Negative for nausea.  Genitourinary: Negative for flank pain.  Musculoskeletal: Negative for back pain.  Skin: Negative for rash.  Neurological: Negative for weakness, numbness and headaches.  Psychiatric/Behavioral: Negative for behavioral problems.    Allergies  Bupropion  Home Medications   Current Outpatient Rx  Name  Route  Sig  Dispense  Refill  . aspirin 81 MG tablet   Oral   Take 81 mg by mouth daily.           . citalopram (CELEXA) 40 MG tablet   Oral   Take 40 mg by mouth daily.           . ERYTHROMYCIN BASE PO   Oral   Take 1 tablet by mouth 2 (two) times daily.         Marland Kitchen glipiZIDE (GLUCOTROL) 10 MG tablet   Oral   Take 10 mg by mouth 2 (two)  times daily before a meal.           . IRON-B12-VITAMINS PO   Oral   Take 1 capsule by mouth daily.         Marland Kitchen omeprazole (PRILOSEC) 40 MG capsule   Oral   Take 1 capsule (40 mg total) by mouth 2 (two) times daily.   60 capsule   11   . Saxagliptin-Metformin (KOMBIGLYZE XR) 2.02-999 MG TB24   Oral   Take 1 tablet by mouth 2 (two) times daily.         . solifenacin (VESICARE) 10 MG tablet   Oral   Take 5 mg by mouth daily.           Marland Kitchen VITAMIN D, ERGOCALCIFEROL, PO   Oral   Take 2,000 mg by mouth daily.         . insulin detemir (LEVEMIR) 100 UNIT/ML injection   Subcutaneous   Inject 36 Units into the skin at bedtime.          . metoCLOPramide (REGLAN) 10 MG tablet   Oral   Take 1 tablet (10 mg total) by mouth every 6 (six) hours as needed.   10 tablet   0   . ondansetron (ZOFRAN-ODT) 8 MG disintegrating tablet    Oral   Take 1 tablet (8 mg total) by mouth every 8 (eight) hours as needed for nausea.   10 tablet   0   . oxyCODONE-acetaminophen (PERCOCET/ROXICET) 5-325 MG per tablet   Oral   Take 1-2 tablets by mouth every 6 (six) hours as needed for pain.   8 tablet   0     BP 120/63  Pulse 77  Temp(Src) 97.5 F (36.4 C) (Oral)  Resp 18  SpO2 95%  LMP 12/12/2012  Physical Exam  Nursing note and vitals reviewed. Constitutional: She is oriented to person, place, and time. She appears well-developed and well-nourished.  HENT:  Head: Normocephalic and atraumatic.  Eyes: EOM are normal. Pupils are equal, round, and reactive to light.  Neck: Normal range of motion. Neck supple.  Cardiovascular: Normal rate, regular rhythm and normal heart sounds.   No murmur heard. Pulmonary/Chest: Effort normal and breath sounds normal. No respiratory distress. She has no wheezes. She has no rales.  Abdominal: Soft. Bowel sounds are normal. She exhibits no distension. There is tenderness. There is no rebound and no guarding.  Diffuse abdominal tenderness without rebound guarding or mass.  Musculoskeletal: Normal range of motion.  Neurological: She is alert and oriented to person, place, and time. No cranial nerve deficit.  Skin: Skin is warm and dry.  Psychiatric: She has a normal mood and affect. Her speech is normal.    ED Course  Procedures (including critical care time)  Labs Reviewed  COMPREHENSIVE METABOLIC PANEL - Abnormal; Notable for the following:    Glucose, Bld 241 (*)    Total Bilirubin 0.2 (*)    All other components within normal limits  CBC WITH DIFFERENTIAL - Abnormal; Notable for the following:    WBC 15.0 (*)    Neutrophils Relative 82 (*)    Neutro Abs 12.3 (*)    All other components within normal limits  URINALYSIS, ROUTINE W REFLEX MICROSCOPIC - Abnormal; Notable for the following:    APPearance CLOUDY (*)    Glucose, UA 100 (*)    Hgb urine dipstick MODERATE (*)     Nitrite POSITIVE (*)    All other components within normal limits  URINE  MICROSCOPIC-ADD ON - Abnormal; Notable for the following:    Squamous Epithelial / LPF FEW (*)    Bacteria, UA MANY (*)    All other components within normal limits  URINE CULTURE  LIPASE, BLOOD  PREGNANCY, URINE   No results found.   1. Abdominal pain   2. Nausea vomiting and diarrhea       MDM  Patient presents with nausea vomiting diarrhea and diffuse abdominal pain. No dysuria. Laboratory showed an elevated white count. She has had episodes of chronic abdominal pain has had some gastroparesis. Patient feels better after treatment with Reglan and IV fluids. Her abdominal exam was mildly tender initially and that has decreased. This point I doubt severe intra-abdominal pathology. Patient feels better as tolerated orals. She'll be discharged home with Zofran and Reglan along with some pain medicine. There were many bacteria in her urine, however only 3-6 white cells. Culture has been sent and will be followed.        Juliet Rude. Rubin Payor, MD 12/29/12 [phone]

## 2012-12-29 NOTE — ED Notes (Addendum)
C/o abd pain, also nvd, onset 1900, last BM ~ 30 minutes ago (watery, no blood, dark brown to black), last emesis ~ 30 minutes ago (no blood), last ate 1930 after pain onset,  No meds PTA.  Pt of GI MD Dr. Ewing Schlein, "he has me on E-mycin".

## 2012-12-31 LAB — URINE CULTURE

## 2013-01-05 NOTE — ED Notes (Signed)
Chart sent to EDP office for review. Rx for Septra DS 1 po BID # 10 written by Dr Juleen China.

## 2013-01-09 ENCOUNTER — Telehealth (HOSPITAL_COMMUNITY): Payer: Self-pay | Admitting: Emergency Medicine

## 2013-01-09 NOTE — ED Notes (Signed)
Rx called to [facility] on [address] by [employer] [facility].

## 2013-08-26 ENCOUNTER — Encounter: Payer: Self-pay | Admitting: Internal Medicine

## 2013-09-02 ENCOUNTER — Ambulatory Visit: Payer: Self-pay | Admitting: Emergency Medicine

## 2013-09-02 ENCOUNTER — Encounter: Payer: Self-pay | Admitting: Emergency Medicine

## 2013-09-02 VITALS — BP 118/78 | HR 74 | Temp 98.0°F | Resp 16 | Ht 63.0 in | Wt 172.0 lb

## 2013-09-02 DIAGNOSIS — R21 Rash and other nonspecific skin eruption: Secondary | ICD-10-CM

## 2013-09-02 DIAGNOSIS — N3281 Overactive bladder: Secondary | ICD-10-CM

## 2013-09-02 DIAGNOSIS — F411 Generalized anxiety disorder: Secondary | ICD-10-CM

## 2013-09-02 LAB — COMPLETE METABOLIC PANEL WITH GFR
ALT: <8 U/L (ref 0–35)
Alkaline Phosphatase: 60 U/L (ref 39–117)
Sodium: 136 mEq/L (ref 135–145)
Total Bilirubin: 0.3 mg/dL (ref 0.3–1.2)
Total Protein: 6.6 g/dL (ref 6.0–8.3)

## 2013-09-02 LAB — CBC WITH DIFFERENTIAL/PLATELET
Basophils Absolute: 0 10*3/uL (ref 0.0–0.1)
Basophils Relative: 0 % (ref 0–1)
Eosinophils Absolute: 0.1 10*3/uL (ref 0.0–0.7)
Lymphs Abs: 1.6 10*3/uL (ref 0.7–4.0)
MCH: 29.3 pg (ref 26.0–34.0)
MCHC: 34.3 g/dL (ref 30.0–36.0)
Neutrophils Relative %: 72 % (ref 43–77)
Platelets: 264 10*3/uL (ref 150–400)
RBC: 4.98 MIL/uL (ref 3.87–5.11)

## 2013-09-02 MED ORDER — FLUCONAZOLE 150 MG PO TABS: 150.0000 mg | ORAL_TABLET | Freq: Every day | ORAL | Status: DC

## 2013-09-02 NOTE — Progress Notes (Signed)
  Subjective:    Patient ID: Michelle Rasmussen, female    DOB: [birth year], 47 y.o.   MRN: [id number]  HPI Comments: 47 yo female presents for DM F/U with new concern of increased yeast infections. Not always taking RX AD due to cost without insurance coverage. She does not check BS AD but has cut back insulin dose with low BS at night from 35-20 units which has resolved the hypoglycemia episodes. Patient wants limited labs due to cost.   Hyperlipidemia  Diabetes  Anxiety       Current Outpatient Prescriptions on File Prior to Visit  Medication Sig Dispense Refill  . aspirin 81 MG tablet Take 81 mg by mouth daily.        . citalopram (CELEXA) 40 MG tablet Take 40 mg by mouth daily.        Marland Kitchen glipiZIDE (GLUCOTROL) 10 MG tablet Take 10 mg by mouth 2 (two) times daily before a meal.        . insulin detemir (LEVEMIR) 100 UNIT/ML injection Inject 20 Units into the skin at bedtime.       . Saxagliptin-Metformin (KOMBIGLYZE XR) 2.02-999 MG TB24 Take 1 tablet by mouth 2 (two) times daily.      Marland Kitchen VITAMIN D, ERGOCALCIFEROL, PO Take 2,000 mg by mouth daily.      . metoCLOPramide (REGLAN) 10 MG tablet Take 1 tablet (10 mg total) by mouth every 6 (six) hours as needed.  10 tablet  0  . ondansetron (ZOFRAN-ODT) 8 MG disintegrating tablet Take 1 tablet (8 mg total) by mouth every 8 (eight) hours as needed for nausea.  10 tablet  0  . solifenacin (VESICARE) 10 MG tablet Take 5 mg by mouth daily.         No current facility-administered medications on file prior to visit.  ALLERGIES Bupropion    Review of Systems  Constitutional: Negative.   HENT: Negative.   Respiratory: Negative.   Cardiovascular: Negative.   Gastrointestinal: Negative.   Endocrine: Negative.   Genitourinary: Positive for frequency.  Musculoskeletal: Negative.   Skin: Positive for rash.  Neurological: Negative.   Psychiatric/Behavioral: Negative.    BP 118/78  Pulse 74  Temp(Src) 98 F (36.7 C) (Temporal)  Resp 16  Ht  5\' 3"  (1.6 m)  Wt 172 lb (78.019 kg)  BMI 30.48 kg/m2     Objective:   Physical Exam  Nursing note and vitals reviewed. Constitutional: She appears well-developed and well-nourished.  Eyes: Pupils are equal, round, and reactive to light.  Neck: Normal range of motion. Neck supple.  Cardiovascular: Normal rate, regular rhythm, normal heart sounds and intact distal pulses.   Pulmonary/Chest: Effort normal and breath sounds normal.  Abdominal: Soft. Bowel sounds are normal.  Musculoskeletal: Normal range of motion.  Neurological: She is alert.  Skin: Skin is warm and dry.  Erythema with patches and occasional satellite lesions under both breasts, groin, and around umbilicus  Psychiatric: She has a normal mood and affect. Judgment normal.          Assessment & Plan:  Check labs DM2 with Insulin continue prescriptions AD. Try to check BS AD. Continue to improve diet, exercise, wt.  Candida- Advise OTC Gold bond powder topically Diflucan 150 mg 1 qwk #3 RF2 add probiotic, hygiene explained. Sx given Kombiglyze 2.02/999 AD #56, Lantus PEN AD #1, Vesicare 10 mg AD #21

## 2013-09-02 NOTE — Patient Instructions (Signed)
Diabetes and Exercise Exercising regularly is important. It is not just about losing weight. It has many health benefits, such as:  Improving your overall fitness, flexibility, and endurance.  Increasing your bone density.  Helping with weight control.  Decreasing your body fat.  Increasing your muscle strength.  Reducing stress and tension.  Improving your overall health. People with diabetes who exercise gain additional benefits because exercise:  Reduces appetite.  Improves the body's use of blood sugar (glucose).  Helps lower or control blood glucose.  Decreases blood pressure.  Helps control blood lipids (such as cholesterol and triglycerides).  Improves the body's use of the hormone insulin by:  Increasing the body's insulin sensitivity.  Reducing the body's insulin needs.  Decreases the risk for heart disease because exercising:  Lowers cholesterol and triglycerides levels.  Increases the levels of good cholesterol (such as high-density lipoproteins [HDL]) in the body.  Lowers blood glucose levels. YOUR ACTIVITY PLAN  Choose an activity that you enjoy and set realistic goals. Your health care provider or diabetes educator can help you make an activity plan that works for you. You can break activities into 2 or 3 sessions throughout the day. Doing so is as good as one long session. Exercise ideas include:  Taking the dog for a walk.  Taking the stairs instead of the elevator.  Dancing to your favorite song.  Doing your favorite exercise with a friend. RECOMMENDATIONS FOR EXERCISING WITH TYPE 1 OR TYPE 2 DIABETES   Check your blood glucose before exercising. If blood glucose levels are greater than 240 mg/dL, check for urine ketones. Do not exercise if ketones are present.  Avoid injecting insulin into areas of the body that are going to be exercised. For example, avoid injecting insulin into:  The arms when playing tennis.  The legs when  jogging.  Keep a record of:  Food intake before and after you exercise.  Expected peak times of insulin action.  Blood glucose levels before and after you exercise.  The type and amount of exercise you have done.  Review your records with your health care provider. Your health care provider will help you to develop guidelines for adjusting food intake and insulin amounts before and after exercising.  If you take insulin or oral hypoglycemic agents, watch for signs and symptoms of hypoglycemia. They include:  Dizziness.  Shaking.  Sweating.  Chills.  Confusion.  Drink plenty of water while you exercise to prevent dehydration or heat stroke. Body water is lost during exercise and must be replaced.  Talk to your health care provider before starting an exercise program to make sure it is safe for you. Remember, almost any type of activity is better than none. Document Released: 01/04/2004 Document Revised: 06/16/2013 Document Reviewed: 03/23/2013 [employer] Patient Information 2014 [employer]. Diabetes Meal Planning Guide The diabetes meal planning guide is a tool to help you plan your meals and snacks. It is important for people with diabetes to manage their blood glucose (sugar) levels. Choosing the right foods and the right amounts throughout your day will help control your blood glucose. Eating right can even help you improve your blood pressure and reach or maintain a healthy weight. CARBOHYDRATE COUNTING MADE EASY When you eat carbohydrates, they turn to sugar. This raises your blood glucose level. Counting carbohydrates can help you control this level so you feel better. When you plan your meals by counting carbohydrates, you can have more flexibility in what you eat and balance your medicine   with your food intake. Carbohydrate counting simply means adding up the total amount of carbohydrate grams in your meals and snacks. Try to eat about the same amount at each meal. Foods  with carbohydrates are listed below. Each portion below is 1 carbohydrate serving or 15 grams of carbohydrates. Ask your dietician how many grams of carbohydrates you should eat at each meal or snack. Grains and Starches  1 slice bread.   English muffin or hotdog/hamburger bun.   cup cold cereal (unsweetened).   cup cooked pasta or rice.   cup starchy vegetables (corn, potatoes, peas, beans, winter squash).  1 tortilla (6 inches).   bagel.  1 waffle or pancake (size of a CD).   cup cooked cereal.  4 to 6 small crackers. *Whole grain is recommended. Fruit  1 cup fresh unsweetened berries, melon, papaya, pineapple.  1 small fresh fruit.   banana or mango.   cup fruit juice (4 oz unsweetened).   cup canned fruit in natural juice or water.  2 tbs dried fruit.  12 to 15 grapes or cherries. Milk and Yogurt  1 cup fat-free or 1% milk.  1 cup soy milk.  6 oz light yogurt with sugar-free sweetener.  6 oz low-fat soy yogurt.  6 oz plain yogurt. Vegetables  1 cup raw or  cup cooked is counted as 0 carbohydrates or a "free" food.  If you eat 3 or more servings at 1 meal, count them as 1 carbohydrate serving. Other Carbohydrates   oz chips or pretzels.   cup ice cream or frozen yogurt.   cup sherbet or sorbet.  2 inch square cake, no frosting.  1 tbs honey, sugar, jam, jelly, or syrup.  2 small cookies.  3 squares of graham crackers.  3 cups popcorn.  6 crackers.  1 cup broth-based soup.  Count 1 cup casserole or other mixed foods as 2 carbohydrate servings.  Foods with less than 20 calories in a serving may be counted as 0 carbohydrates or a "free" food. You may want to purchase a book or computer software that lists the carbohydrate gram counts of different foods. In addition, the nutrition facts panel on the labels of the foods you eat are a good source of this information. The label will tell you how big the serving size is and the  total number of carbohydrate grams you will be eating per serving. Divide this number by 15 to obtain the number of carbohydrate servings in a portion. Remember, 1 carbohydrate serving equals 15 grams of carbohydrate. SERVING SIZES Measuring foods and serving sizes helps you make sure you are getting the right amount of food. The list below tells how big or small some common serving sizes are.  1 oz.........4 stacked dice.  3 oz.........Deck of cards.  1 tsp........Tip of little finger.  1 tbs........Thumb.  2 tbs........Golf ball.   cup.......Half of a fist.  1 cup........A fist. SAMPLE DIABETES MEAL PLAN Below is a sample meal plan that includes foods from the grain and starches, dairy, vegetable, fruit, and meat groups. A dietician can individualize a meal plan to fit your calorie needs and tell you the number of servings needed from each food group. However, controlling the total amount of carbohydrates in your meal or snack is more important than making sure you include all of the food groups at every meal. You may interchange carbohydrate containing foods (dairy, starches, and fruits). The meal plan below is an example of a 2000 calorie diet   using carbohydrate counting. This meal plan has 17 carbohydrate servings. Breakfast  1 cup oatmeal (2 carb servings).   cup light yogurt (1 carb serving).  1 cup blueberries (1 carb serving).   cup almonds. Snack  1 large apple (2 carb servings).  1 low-fat string cheese stick. Lunch  Chicken breast salad.  1 cup spinach.   cup chopped tomatoes.  2 oz chicken breast, sliced.  2 tbs low-fat [place] dressing.  12 whole-wheat crackers (2 carb servings).  12 to 15 grapes (1 carb serving).  1 cup low-fat milk (1 carb serving). Snack  1 cup carrots.   cup hummus (1 carb serving). Dinner  3 oz broiled salmon.  1 cup brown rice (3 carb servings). Snack  1  cups steamed broccoli (1 carb serving) drizzled with 1  tsp olive oil and lemon juice.  1 cup light pudding (2 carb servings). DIABETES MEAL PLANNING WORKSHEET Your dietician can use this worksheet to help you decide how many servings of foods and what types of foods are right for you.  BREAKFAST Food Group and Servings / Carb Servings Grain/Starches __________________________________ Dairy __________________________________________ Vegetable ______________________________________ Fruit ___________________________________________ Meat __________________________________________ Fat ____________________________________________ LUNCH Food Group and Servings / Carb Servings Grain/Starches ___________________________________ Dairy ___________________________________________ Fruit ____________________________________________ Meat ___________________________________________ Fat _____________________________________________ [place] Food Group and Servings / Carb Servings Grain/Starches ___________________________________ Dairy ___________________________________________ Fruit ____________________________________________ Meat ___________________________________________ Fat _____________________________________________ SNACKS Food Group and Servings / Carb Servings Grain/Starches ___________________________________ Dairy ___________________________________________ Vegetable _______________________________________ Fruit ____________________________________________ Meat ___________________________________________ Fat _____________________________________________ DAILY TOTALS Starches _________________________ Vegetable ________________________ Fruit ____________________________ Dairy ____________________________ Meat ____________________________ Fat ______________________________ Document Released: 07/11/2005 Document Revised: 01/06/2012 Document Reviewed: 05/22/2009 [employer] Patient Information 2014 [employer]. Candida Infection,  Adult A candida infection (also called yeast, fungus and Monilia infection) is an overgrowth of yeast that can occur anywhere on the body. A yeast infection commonly occurs in warm, moist body areas. Usually, the infection remains localized but can spread to become a systemic infection. A yeast infection may be a sign of a more severe disease such as diabetes, leukemia, or AIDS. A yeast infection can occur in both men and women. In women, Candida vaginitis is a vaginal infection. It is one of the most common causes of vaginitis. Men usually do not have symptoms or know they have an infection until other problems develop. Men may find out they have a yeast infection because their sex partner has a yeast infection. Uncircumcised men are more likely to get a yeast infection than circumcised men. This is because the uncircumcised glans is not exposed to air and does not remain as dry as that of a circumcised glans. Older adults may develop yeast infections around dentures. CAUSES  Women  Antibiotics.  Steroid medication taken for a long time.  Being overweight (obese).  Diabetes.  Poor immune condition.  Certain serious medical conditions.  Immune suppressive medications for organ transplant patients.  Chemotherapy.  Pregnancy.  Menstration.  Stress and fatigue.  Intravenous drug use.  Oral contraceptives.  Wearing tight-fitting clothes in the crotch area.  Catching it from a sex partner who has a yeast infection.  Spermicide.  Intravenous, urinary, or other catheters. Men  Catching it from a sex partner who has a yeast infection.  Having oral or anal sex with a person who has the infection.  Spermicide.  Diabetes.  Antibiotics.  Poor immune system.  Medications that suppress the immune system.  Intravenous drug use.  Intravenous, urinary, or other catheters. SYMPTOMS  Women  Thick, white  vaginal discharge.  Vaginal itching.  Redness and swelling in and  around the vagina.  Irritation of the lips of the vagina and perineum.  Blisters on the vaginal lips and perineum.  Painful sexual intercourse.  Low blood sugar (hypoglycemia).  Painful urination.  Bladder infections.  Intestinal problems such as constipation, indigestion, bad breath, bloating, increase in gas, diarrhea, or loose stools. Men  Men may develop intestinal problems such as constipation, indigestion, bad breath, bloating, increase in gas, diarrhea, or loose stools.  Dry, cracked skin on the penis with itching or discomfort.  Jock itch.  Dry, flaky skin.  Athlete's foot.  Hypoglycemia. DIAGNOSIS  Women  A history and an exam are performed.  The discharge may be examined under a microscope.  A culture may be taken of the discharge. Men  A history and an exam are performed.  Any discharge from the penis or areas of cracked skin will be looked at under the microscope and cultured.  Stool samples may be cultured. TREATMENT  Women  Vaginal antifungal suppositories and creams.  Medicated creams to decrease irritation and itching on the outside of the vagina.  Warm compresses to the perineal area to decrease swelling and discomfort.  Oral antifungal medications.  Medicated vaginal suppositories or cream for repeated or recurrent infections.  Wash and dry the irritation areas before applying the cream.  Eating yogurt with lactobacillus may help with prevention and treatment.  Sometimes painting the vagina with gentian violet solution may help if creams and suppositories do not work. Men  Antifungal creams and oral antifungal medications.  Sometimes treatment must continue for 30 days after the symptoms go away to prevent recurrence. HOME CARE INSTRUCTIONS  Women  Use cotton underwear and avoid tight-fitting clothing.  Avoid colored, scented toilet paper and deodorant tampons or pads.  Do not douche.  Keep your diabetes under  control.  Finish all the prescribed medications.  Keep your skin clean and dry.  Consume milk or yogurt with lactobacillus active culture regularly. If you get frequent yeast infections and think that is what the infection is, there are over-the-counter medications that you can get. If the infection does not show healing in 3 days, talk to your caregiver.  Tell your sex partner you have a yeast infection. Your partner may need treatment also, especially if your infection does not clear up or recurs. Men  Keep your skin clean and dry.  Keep your diabetes under control.  Finish all prescribed medications.  Tell your sex partner that you have a yeast infection so they can be treated if necessary. SEEK MEDICAL CARE IF:   Your symptoms do not clear up or worsen in one week after treatment.  You have an oral temperature above 102 F (38.9 C).  You have trouble swallowing or eating for a prolonged time.  You develop blisters on and around your vagina.  You develop vaginal bleeding and it is not your menstrual period.  You develop abdominal pain.  You develop intestinal problems as mentioned above.  You get weak or lightheaded.  You have painful or increased urination.  You have pain during sexual intercourse. MAKE SURE YOU:   Understand these instructions.  Will watch your condition.  Will get help right away if you are not doing well or get worse. Document Released: 11/21/2004 Document Revised: 01/06/2012 Document Reviewed: 03/05/2010 [facility] Patient Information 2014 [place], [place].

## 2013-09-03 ENCOUNTER — Telehealth: Payer: Self-pay | Admitting: *Deleted

## 2013-09-03 DIAGNOSIS — E119 Type 2 diabetes mellitus without complications: Secondary | ICD-10-CM

## 2013-09-03 MED ORDER — GLIPIZIDE 10 MG PO TABS: 10.0000 mg | ORAL_TABLET | Freq: Two times a day (BID) | ORAL | Status: DC

## 2013-09-03 NOTE — Progress Notes (Signed)
Advised pt of elevated A1C results.  Advised better control of diet and exercise.

## 2013-09-03 NOTE — Telephone Encounter (Signed)
Message copied by Nicholaus Corolla A on Fri Sep 03, 2013  2:47 PM ------      Message from: [place], [place] R      Created: Fri Sep 03, 2013  2:10 PM       A1c still mild elevation continue RX same and improve diet. REcheck 4months OV ------

## 2013-09-03 NOTE — Telephone Encounter (Signed)
Spoke with pt about lab results.  Advised A1C still elevated and needs better diet control per Loree Fee, PA-C.  Rx for Glipizide sent to pt's pharmacy.

## 2013-09-14 ENCOUNTER — Other Ambulatory Visit: Payer: Self-pay | Admitting: Emergency Medicine

## 2013-09-14 DIAGNOSIS — R21 Rash and other nonspecific skin eruption: Secondary | ICD-10-CM

## 2013-09-14 DIAGNOSIS — B372 Candidiasis of skin and nail: Secondary | ICD-10-CM

## 2013-09-14 MED ORDER — FLUCONAZOLE 150 MG PO TABS: 150.0000 mg | ORAL_TABLET | Freq: Every day | ORAL | Status: DC

## 2013-11-12 ENCOUNTER — Other Ambulatory Visit: Payer: Self-pay | Admitting: Physician Assistant

## 2013-11-12 MED ORDER — CITALOPRAM HYDROBROMIDE 40 MG PO TABS: 40.0000 mg | ORAL_TABLET | Freq: Every day | ORAL | Status: DC

## 2013-11-15 ENCOUNTER — Other Ambulatory Visit: Payer: Self-pay

## 2013-11-15 MED ORDER — CITALOPRAM HYDROBROMIDE 40 MG PO TABS: 40.0000 mg | ORAL_TABLET | Freq: Every day | ORAL | Status: DC

## 2013-11-24 ENCOUNTER — Other Ambulatory Visit: Payer: Self-pay | Admitting: *Deleted

## 2013-11-24 MED ORDER — SAXAGLIPTIN-METFORMIN ER 2.5-1000 MG PO TB24: 1.0000 | ORAL_TABLET | Freq: Two times a day (BID) | ORAL | Status: DC

## 2013-12-15 ENCOUNTER — Other Ambulatory Visit: Payer: Self-pay | Admitting: Emergency Medicine

## 2013-12-16 ENCOUNTER — Encounter: Payer: Self-pay | Admitting: Emergency Medicine

## 2013-12-27 ENCOUNTER — Other Ambulatory Visit: Payer: Self-pay | Admitting: Internal Medicine

## 2014-01-06 ENCOUNTER — Ambulatory Visit: Payer: Self-pay | Admitting: Emergency Medicine

## 2014-02-07 ENCOUNTER — Ambulatory Visit (INDEPENDENT_AMBULATORY_CARE_PROVIDER_SITE_OTHER): Payer: Self-pay | Admitting: Emergency Medicine

## 2014-02-07 ENCOUNTER — Encounter: Payer: Self-pay | Admitting: Emergency Medicine

## 2014-02-07 ENCOUNTER — Ambulatory Visit: Payer: Self-pay | Admitting: Emergency Medicine

## 2014-02-07 VITALS — BP 124/82 | HR 66 | Temp 98.0°F | Resp 16 | Ht 63.0 in | Wt 178.0 lb

## 2014-02-07 DIAGNOSIS — R5383 Other fatigue: Secondary | ICD-10-CM

## 2014-02-07 DIAGNOSIS — R5381 Other malaise: Secondary | ICD-10-CM

## 2014-02-07 DIAGNOSIS — M25579 Pain in unspecified ankle and joints of unspecified foot: Secondary | ICD-10-CM

## 2014-02-07 DIAGNOSIS — E781 Pure hyperglyceridemia: Secondary | ICD-10-CM

## 2014-02-07 DIAGNOSIS — E119 Type 2 diabetes mellitus without complications: Secondary | ICD-10-CM

## 2014-02-07 DIAGNOSIS — B379 Candidiasis, unspecified: Secondary | ICD-10-CM

## 2014-02-07 LAB — CBC WITH DIFFERENTIAL/PLATELET
BASOS PCT: 0 % (ref 0–1)
Basophils Absolute: 0 10*3/uL (ref 0.0–0.1)
EOS ABS: 0.3 10*3/uL (ref 0.0–0.7)
Eosinophils Relative: 4 % (ref 0–5)
HCT: 42.9 % (ref 36.0–46.0)
Hemoglobin: 14.3 g/dL (ref 12.0–15.0)
LYMPHS ABS: 1.9 10*3/uL (ref 0.7–4.0)
Lymphocytes Relative: 24 % (ref 12–46)
MCH: 29.4 pg (ref 26.0–34.0)
MCHC: 33.3 g/dL (ref 30.0–36.0)
MCV: 88.1 fL (ref 78.0–100.0)
Monocytes Absolute: 0.6 10*3/uL (ref 0.1–1.0)
Monocytes Relative: 8 % (ref 3–12)
NEUTROS ABS: 5.1 10*3/uL (ref 1.7–7.7)
NEUTROS PCT: 64 % (ref 43–77)
PLATELETS: 289 10*3/uL (ref 150–400)
RBC: 4.87 MIL/uL (ref 3.87–5.11)
RDW: 15.2 % (ref 11.5–15.5)
WBC: 7.9 10*3/uL (ref 4.0–10.5)

## 2014-02-07 LAB — HEMOGLOBIN A1C
HEMOGLOBIN A1C: 8.4 % — AB (ref <5.7)
MEAN PLASMA GLUCOSE: 194 mg/dL — AB (ref <117)

## 2014-02-07 MED ORDER — CANAGLIFLOZIN 300 MG PO TABS: ORAL_TABLET | ORAL | Status: DC

## 2014-02-07 MED ORDER — FLUCONAZOLE 150 MG PO TABS: 150.0000 mg | ORAL_TABLET | ORAL | Status: AC

## 2014-02-07 NOTE — Patient Instructions (Addendum)
Bad carbs also include fruit juice, alcohol, and sweet tea. These are empty calories that do not signal to your brain that you are full.   Please remember the good carbs are still carbs which convert into sugar. So please measure them out no more than 1/2-1 cup of rice, oatmeal, pasta, and beans.  Veggies are however free foods! Pile them on.   I like lean protein at every meal such as chicken, [place], pork chops, cottage cheese, etc. Just do not fry these meats and please center your meal around vegetable, the meats should be a side dish.   No all fruit is created equal. Please see the list below, the fruit at the bottom is higher in sugars than the fruit at the top   We want weight loss that will last so you should lose 1-2 pounds a week.  THAT IS IT! Please pick THREE things a month to change. Once it is a habit check off the item. Then pick another three items off the list to become habits.  If you are already doing a habit on the list GREAT!  Cross that item off! o Don't drink your calories. Ie, alcohol, soda, fruit juice, and sweet tea.  o Drink more water. Drink a glass when you feel hungry or before each meal.  o Eat breakfast - Complex carb and protein (likeDannon light and fit yogurt, oatmeal, fruit, eggs, [place] bacon). o Measure your cereal.  Eat no more than one cup a day. (ie [place]) o Eat an apple a day. o Add a vegetable a day. o Try a new vegetable a month. o Use Pam! Stop using oil or butter to cook. o Don't finish your plate or use smaller plates. o Share your dessert. o Eat sugar free Jello for dessert or frozen grapes. o Don't eat 2-3 hours before bed. o Switch to whole wheat bread, pasta, and brown rice. o Make healthier choices when you eat out. No fries! o Pick baked chicken, NOT fried. o Don't forget to SLOW DOWN when you eat. It is not going anywhere.  o Take the stairs. o Park far away in the parking lot o [employer] (or weights) for 10 minutes while  watching TV. o Walk at work for 10 minutes during break. o Walk outside 1 time a week with your friend, kids, dog, or significant other. o Start a walking group at [place] the mall as much as you can tolerate.  o Keep a food diary. o Weigh yourself daily. o Walk for 15 minutes 3 days per week. o Cook at home more often and eat out less.  If life happens and you go back to old habits, it is okay.  Just start over. You can do it!   If you experience chest pain, get short of breath, or tired during the exercise, please stop immediately and inform your doctor.  Smoking Cessation, Tips for Success If you are ready to quit smoking, congratulations! You have chosen to help yourself be healthier. Cigarettes bring nicotine, tar, carbon monoxide, and other irritants into your body. Your lungs, heart, and blood vessels will be able to work better without these poisons. There are many different ways to quit smoking. Nicotine gum, nicotine patches, a nicotine inhaler, or nicotine nasal spray can help with physical craving. Hypnosis, support groups, and medicines help break the habit of smoking. WHAT THINGS CAN I DO TO MAKE QUITTING EASIER?  Here are some tips to help  you quit for good:  Pick a date when you will quit smoking completely. Tell all of your friends and family about your plan to quit on that date.  Do not try to slowly cut down on the number of cigarettes you are smoking. Pick a quit date and quit smoking completely starting on that day.  Throw away all cigarettes.   Clean and remove all ashtrays from your home, work, and car.   On a card, write down your reasons for quitting. Carry the card with you and read it when you get the urge to smoke.   Cleanse your body of nicotine. Drink enough water and fluids to keep your urine clear or pale yellow. Do this after quitting to flush the nicotine from your body.   Learn to predict your moods. Do not let a bad situation be your excuse  to have a cigarette. Some situations in your life might tempt you into wanting a cigarette.   Never have "just one" cigarette. It leads to wanting another and another. Remind yourself of your decision to quit.   Change habits associated with smoking. If you smoked while driving or when feeling stressed, try other activities to replace smoking. Stand up when drinking your coffee. Brush your teeth after eating. Sit in a different chair when you read the paper. Avoid alcohol while trying to quit, and try to drink fewer caffeinated beverages. Alcohol and caffeine may urge you to smoke.   Avoid foods and drinks that can trigger a desire to smoke, such as sugary or spicy foods and alcohol.   Ask people who smoke not to smoke around you.   Have something planned to do right after eating or having a cup of coffee. For example, plan to take a walk or exercise.   Try a relaxation exercise to calm you down and decrease your stress. Remember, you may be tense and nervous for the first 2 weeks after you quit, but this will pass.   Find new activities to keep your hands busy. Play with a pen, coin, or rubber band. Doodle or draw things on paper.   Brush your teeth right after eating. This will help cut down on the craving for the taste of tobacco after meals. You can also try mouthwash.   Use oral substitutes in place of cigarettes. Try using lemon drops, carrots, cinnamon sticks, or chewing gum. Keep them handy so they are available when you have the urge to smoke.   When you have the urge to smoke, try deep breathing.   Designate your home as a nonsmoking area.   If you are a heavy smoker, ask your health care provider about a prescription for nicotine chewing gum. It can ease your withdrawal from nicotine.   Reward yourself. Set aside the cigarette money you save and buy yourself something nice.   Look for support from others. Join a support group or smoking cessation program. Ask  someone at home or at work to help you with your plan to quit smoking.   Always ask yourself, "Do I need this cigarette or is this just a reflex?" Tell yourself, "Today, I choose not to smoke," or "I do not want to smoke." You are reminding yourself of your decision to quit.  Do not replace cigarette smoking with electronic cigarettes (commonly called e-cigarettes). The safety of e-cigarettes is unknown, and some may contain harmful chemicals.  If you relapse, do not give up! Plan ahead and think about what you  will do the next time you get the urge to smoke.  HOW WILL I FEEL WHEN I QUIT SMOKING? You may have symptoms of withdrawal because your body is used to nicotine (the addictive substance in cigarettes). You may crave cigarettes, be irritable, feel very hungry, cough often, get headaches, or have difficulty concentrating. The withdrawal symptoms are only temporary. They are strongest when you first quit but will go away within 10 14 days. When withdrawal symptoms occur, stay in control. Think about your reasons for quitting. Remind yourself that these are signs that your body is healing and getting used to being without cigarettes. Remember that withdrawal symptoms are easier to treat than the major diseases that smoking can cause.  Even after the withdrawal is over, expect periodic urges to smoke. However, these cravings are generally short lived and will go away whether you smoke or not. Do not smoke!  WHAT RESOURCES ARE AVAILABLE TO HELP ME QUIT SMOKING? Your health care provider can direct you to community resources or hospitals for support, which may include:  Group support.  Education.  Hypnosis.  Therapy. Document Released: 07/12/2004 Document Revised: 08/04/2013 Document Reviewed: 04/01/2013 [facility] Patient Information 2014 [place], [place]. Gout Gout is when your joints become red, sore, and swell (inflammed). This is caused by the buildup of uric acid crystals in the joints.  Uric acid is a chemical that is normally in the blood. If the level of uric acid gets too high in the blood, these crystals form in your joints and tissues. Over time, these crystals can form into masses near the joints and tissues. These masses can destroy bone and cause the bone to look misshapen (deformed). HOME CARE   Do not take aspirin for pain.  Only take medicine as told by your doctor.  Rest the joint as much as you can. When in bed, keep sheets and blankets off painful areas.  Keep the sore joints raised (elevated).  Put warm or cold packs on painful joints. Use of warm or cold packs depends on which works best for you.  Use crutches if the painful joint is in your leg.  Drink enough fluids to keep your pee (urine) clear or pale yellow. Limit alcohol, sugary drinks, and drinks with fructose in them.  Follow your diet instructions. Pay careful attention to how much protein you eat. Include fruits, vegetables, whole grains, and fat-free or low-fat milk products in your daily diet. Talk to your doctor or dietician about the use of coffee, vitamin C, and cherries. These may help lower uric acid levels.  Keep a healthy body weight. GET HELP RIGHT AWAY IF:   You have watery poop (diarrhea), throw up (vomit), or have any side effects from medicines.  You do not feel better in 24 hours, or you are getting worse.  Your joint becomes suddenly more tender, and you have chills or a fever. MAKE SURE YOU:   Understand these instructions.  Will watch your condition.  Will get help right away if you are not doing well or get worse. Document Released: 07/23/2008 Document Revised: 02/08/2013 Document Reviewed: 01/22/2010 [facility] Patient Information 2014 [place].

## 2014-02-07 NOTE — Progress Notes (Signed)
Subjective:    Patient ID: Michelle Rasmussen, female    DOB: Jun 13, 1966, 48 y.o.   MRN: [id number]  HPI Comments: 48 yo overweight WF non-compliant DM/ Cholesterol f/u. She was out of diabetic medicines x 1 month but has restarted x 30 days. She is not checking BS.She is staying tired and notes yeast infections have increased. She has not been exercising but has been keeping active. She is not eating healthy. She is drinking more DR Pepper and mountain dew. Her last A1c 7.1, her labs have been limited at her request due to cost issues without insurance.  She has noted mild increased ankle/ foot pain with out injury/ strain. She notes occasionally mild edema and redness.   Diabetes Associated symptoms include fatigue.    Current Outpatient Prescriptions on File Prior to Visit  Medication Sig Dispense Refill  . citalopram (CELEXA) 40 MG tablet Take 1 tablet (40 mg total) by mouth daily.  30 tablet  3  . glipiZIDE (GLUCOTROL) 10 MG tablet Take 1 tablet (10 mg total) by mouth 2 (two) times daily before a meal.  180 tablet  1  . insulin detemir (LEVEMIR) 100 UNIT/ML injection Inject 20 Units into the skin at bedtime.       . Saxagliptin-Metformin (KOMBIGLYZE XR) 2.02-999 MG TB24 Take 1 tablet by mouth 2 (two) times daily.  24 tablet  0  . solifenacin (VESICARE) 10 MG tablet Take 5 mg by mouth daily.         No current facility-administered medications on file prior to visit.     Allergies  Allergen Reactions  . Bupropion Hives   Past Medical History  Diagnosis Date  . Diabetes mellitus   . 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 Dizzy spells     x2 years  . Tobacco abuse   . Anemia   . OAB (overactive bladder)   . Hyperlipidemia   . Anemia   . Depression       Review of Systems  Constitutional: Positive for fatigue.  Musculoskeletal: Positive for arthralgias.  Skin: Positive for rash.  All other systems reviewed and are negative.  BP 124/82  Pulse 66  Temp(Src) 98 F  (36.7 C) (Temporal)  Resp 16  Ht 5\' 3"  (1.6 m)  Wt 178 lb (80.74 kg)  BMI 31.54 kg/m2  LMP 01/07/2014     Objective:   Physical Exam  Nursing note and vitals reviewed. Constitutional: She is oriented to person, place, and time. She appears well-developed and well-nourished. No distress.  Central obesity  HENT:  Head: Normocephalic and atraumatic.  Right Ear: External ear normal.  Left Ear: External ear normal.  Nose: Nose normal.  Mouth/Throat: Oropharynx is clear and moist.  Eyes: Conjunctivae and EOM are normal.  Neck: Normal range of motion. Neck supple. No JVD present. No thyromegaly present.  Cardiovascular: Normal rate, regular rhythm, normal heart sounds and intact distal pulses.   Pulmonary/Chest: Effort normal and breath sounds normal.  Abdominal: Soft. Bowel sounds are normal. She exhibits no distension and no mass. There is no tenderness. There is no rebound and no guarding.  Genitourinary:  declines  Musculoskeletal: Normal range of motion. She exhibits no edema and no tenderness.  Lymphadenopathy:    She has no cervical adenopathy.  Neurological: She is alert and oriented to person, place, and time. No cranial nerve deficit.  Skin: Skin is warm and dry. No rash noted. No erythema. No pallor.  Exposed  area WNL  Psychiatric: Her behavior is normal. Judgment and thought content normal.  Almost flat affect          Assessment & Plan:  1. DM 2 uncontrolled with non-compliance with medications/ lifestyle due to cost-  With recurrent yeast infections before Farxiga and increased infections with [place]- Switch from [place] to Invokana 300 mg SX given #35, call with results  2. Cholesterol- recheck labs, Need to eat healthier and exercise AD.  3. Fatigue vs mild depression- check labs, increase activity and H2O, may need RX adjustment, w/c if SX increase or ER.   4. Foot/ ankle pain- Declines xray due to cost, check labs, advised R.I.C.E.Warned of risks of DM  complications with feet/ ankles

## 2014-02-08 LAB — COMPREHENSIVE METABOLIC PANEL
ALK PHOS: 72 U/L (ref 39–117)
ALT: 12 U/L (ref 0–35)
AST: 15 U/L (ref 0–37)
Albumin: 4 g/dL (ref 3.5–5.2)
BILIRUBIN TOTAL: 0.3 mg/dL (ref 0.2–1.2)
BUN: 10 mg/dL (ref 6–23)
CO2: 24 meq/L (ref 19–32)
CREATININE: 0.56 mg/dL (ref 0.50–1.10)
Calcium: 8.9 mg/dL (ref 8.4–10.5)
Chloride: 104 mEq/L (ref 96–112)
Glucose, Bld: 157 mg/dL — ABNORMAL HIGH (ref 70–99)
Potassium: 3.9 mEq/L (ref 3.5–5.3)
Sodium: 138 mEq/L (ref 135–145)
Total Protein: 6.3 g/dL (ref 6.0–8.3)

## 2014-02-08 LAB — LIPID PANEL
Cholesterol: 164 mg/dL (ref 0–200)
HDL: 37 mg/dL — AB (ref >39)
LDL CALC: 68 mg/dL (ref 0–99)
Total CHOL/HDL Ratio: 4.4 Ratio
Triglycerides: 296 mg/dL — ABNORMAL HIGH (ref <150)
VLDL: 59 mg/dL — ABNORMAL HIGH (ref 0–40)

## 2014-02-08 LAB — TSH: TSH: 0.873 u[IU]/mL (ref 0.350–4.500)

## 2014-02-08 LAB — URIC ACID: URIC ACID, SERUM: 3.4 mg/dL (ref 2.4–7.0)

## 2014-03-07 ENCOUNTER — Other Ambulatory Visit: Payer: Self-pay | Admitting: *Deleted

## 2014-03-07 IMAGING — CR DG CHEST 2V
2 series · 2 of 2 positions shown · non-contrast
Comparison: 02/12/2010.

CLINICAL DATA: Cough.  Malaise and fatigue.

CHEST - 2 VIEW

[view not recorded (1 of 2)]
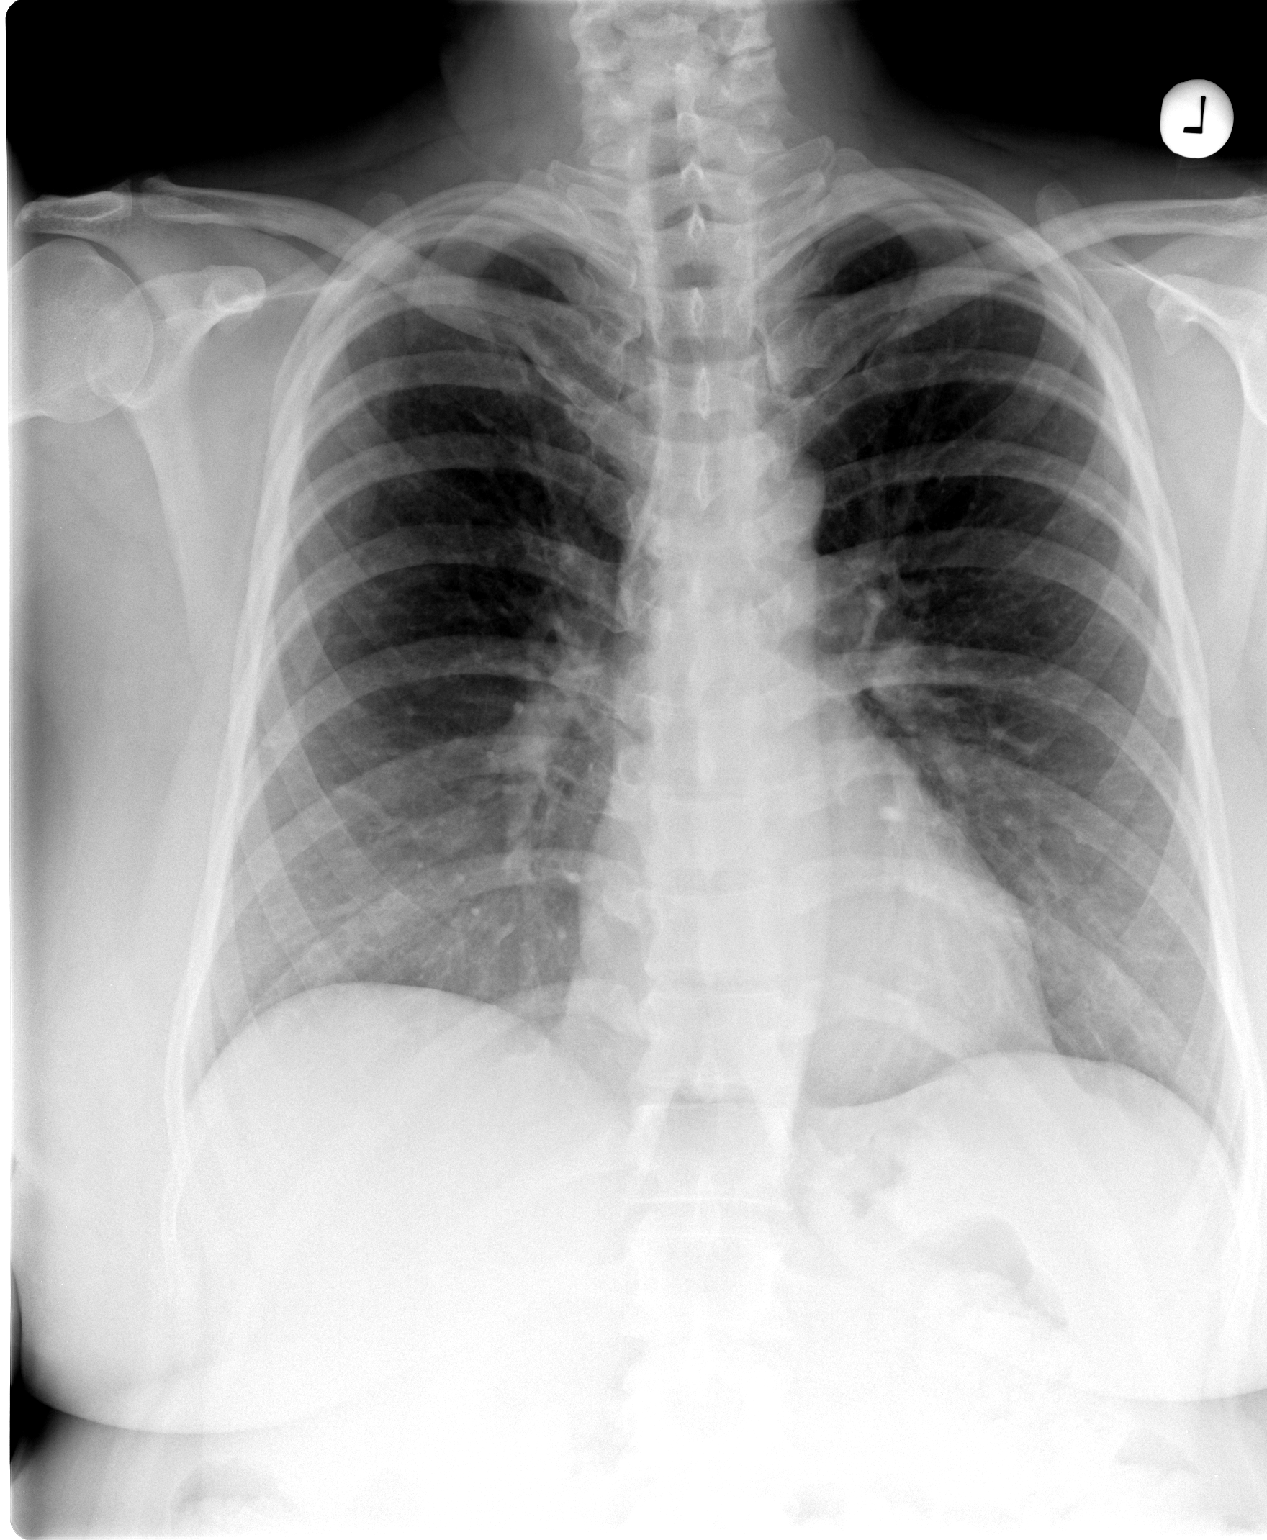

[view not recorded (2 of 2)]
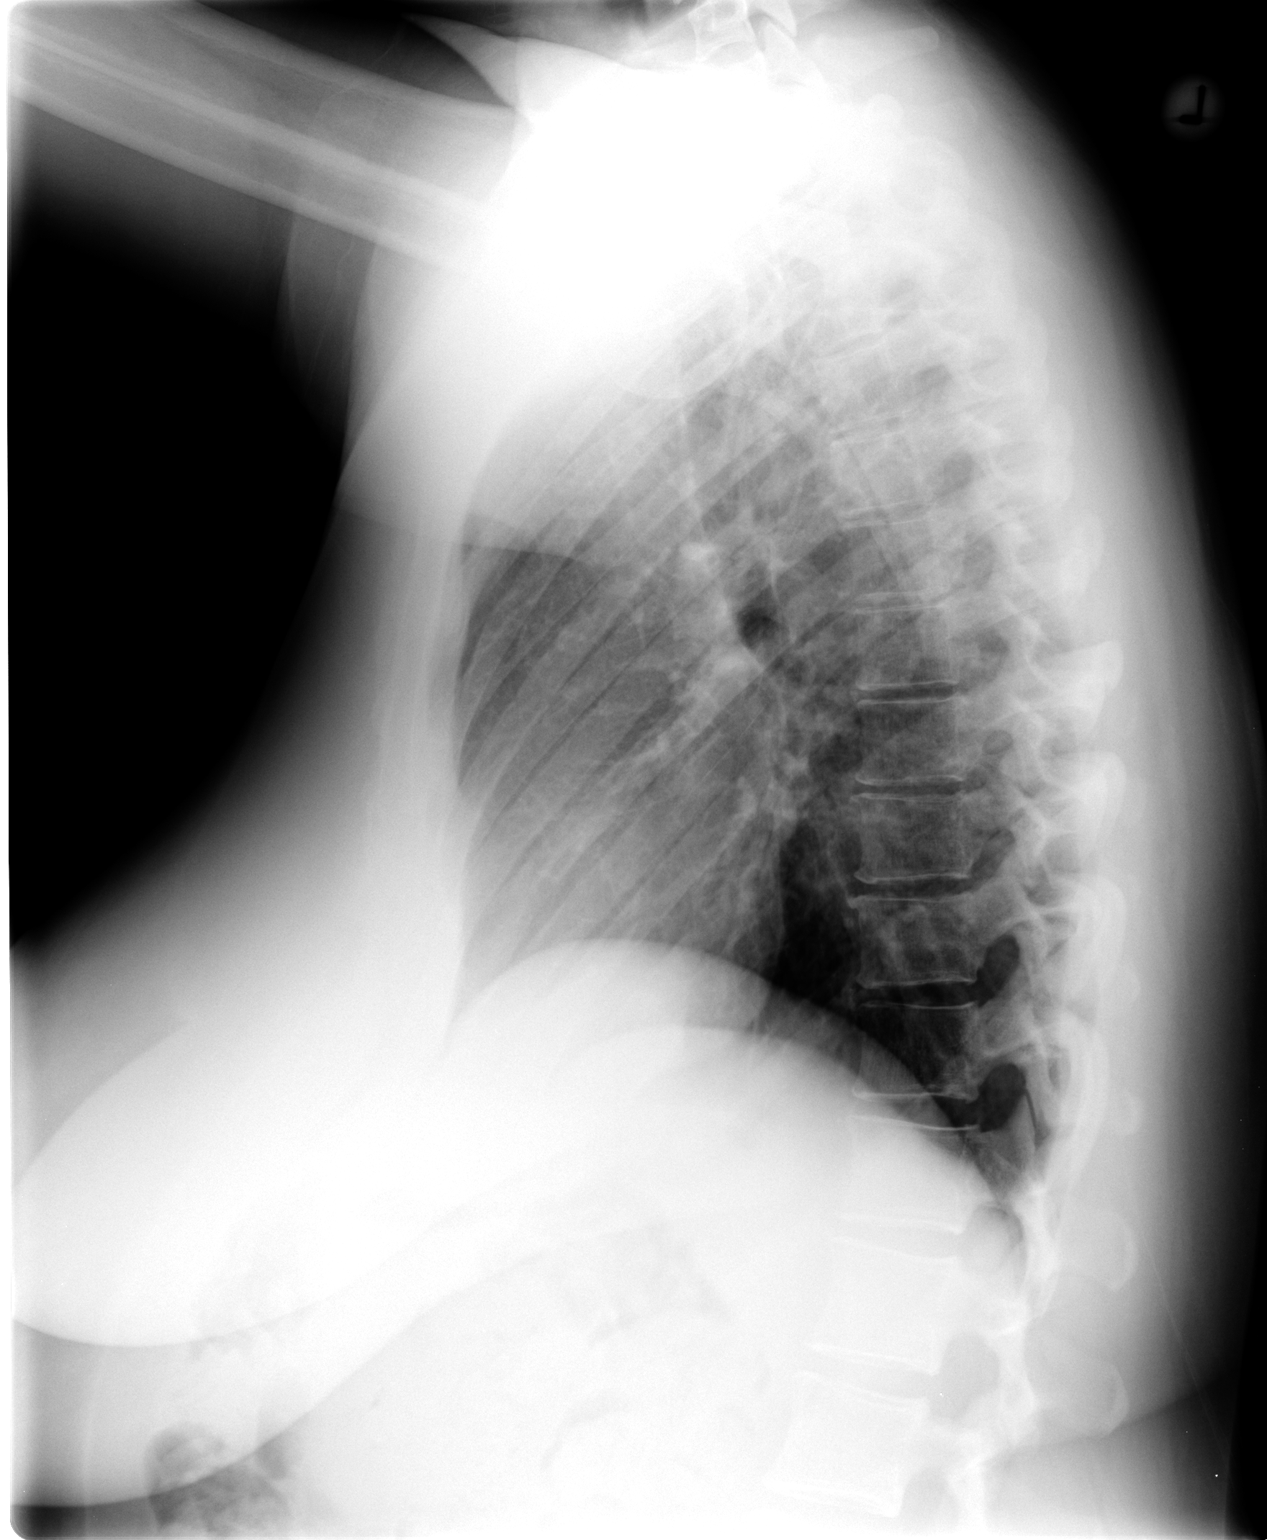

[2 of 2 positions shown; findings below may reference images not displayed]

FINDINGS: Normal sized heart.  Clear lungs.  Mild thoracic spine
degenerative changes.
IMPRESSION: No acute abnormality.

## 2014-03-07 MED ORDER — SAXAGLIPTIN-METFORMIN ER 2.5-1000 MG PO TB24: 1.0000 | ORAL_TABLET | Freq: Two times a day (BID) | ORAL | Status: DC

## 2014-04-04 ENCOUNTER — Other Ambulatory Visit: Payer: Self-pay | Admitting: Emergency Medicine

## 2014-04-04 ENCOUNTER — Other Ambulatory Visit: Payer: Self-pay | Admitting: Physician Assistant

## 2014-10-18 ENCOUNTER — Other Ambulatory Visit: Payer: Self-pay | Admitting: Internal Medicine

## 2016-03-19 ENCOUNTER — Encounter: Payer: Self-pay | Admitting: *Deleted

## 2016-03-19 NOTE — Telephone Encounter (Signed)
This encounter was created in error - please disregard.

## 2017-03-18 ENCOUNTER — Encounter: Payer: Self-pay | Admitting: Gastroenterology

## 2018-05-19 ENCOUNTER — Other Ambulatory Visit: Payer: Self-pay

## 2018-05-19 ENCOUNTER — Emergency Department (HOSPITAL_COMMUNITY): Payer: Self-pay

## 2018-05-19 ENCOUNTER — Encounter (HOSPITAL_COMMUNITY): Payer: Self-pay | Admitting: Emergency Medicine

## 2018-05-19 ENCOUNTER — Emergency Department (HOSPITAL_COMMUNITY)
Admission: EM | Admit: 2018-05-19 | Discharge: 2018-05-19 | Disposition: A | Discharge status: Home / Self Care | Payer: Self-pay | Attending: Emergency Medicine | Admitting: Emergency Medicine

## 2018-05-19 DIAGNOSIS — E119 Type 2 diabetes mellitus without complications: Secondary | ICD-10-CM | POA: Insufficient documentation

## 2018-05-19 DIAGNOSIS — Z79899 Other long term (current) drug therapy: Secondary | ICD-10-CM | POA: Insufficient documentation

## 2018-05-19 DIAGNOSIS — Z794 Long term (current) use of insulin: Secondary | ICD-10-CM | POA: Insufficient documentation

## 2018-05-19 DIAGNOSIS — J040 Acute laryngitis: Secondary | ICD-10-CM | POA: Insufficient documentation

## 2018-05-19 DIAGNOSIS — F17210 Nicotine dependence, cigarettes, uncomplicated: Secondary | ICD-10-CM | POA: Insufficient documentation

## 2018-05-19 DIAGNOSIS — R05 Cough: Secondary | ICD-10-CM

## 2018-05-19 DIAGNOSIS — R053 Chronic cough: Secondary | ICD-10-CM

## 2018-05-19 LAB — COMPREHENSIVE METABOLIC PANEL
ALK PHOS: 86 U/L (ref 38–126)
ALT: 21 U/L (ref 0–44)
ANION GAP: 9 (ref 5–15)
AST: 23 U/L (ref 15–41)
Albumin: 3.5 g/dL (ref 3.5–5.0)
BILIRUBIN TOTAL: 0.5 mg/dL (ref 0.3–1.2)
BUN: 11 mg/dL (ref 6–20)
CALCIUM: 9.1 mg/dL (ref 8.9–10.3)
CO2: 25 mmol/L (ref 22–32)
Chloride: 106 mmol/L (ref 98–111)
Creatinine, Ser: 0.71 mg/dL (ref 0.44–1.00)
GFR calc Af Amer: >60 mL/min (ref >60)
Glucose, Bld: 253 mg/dL — ABNORMAL HIGH (ref 70–99)
POTASSIUM: 4.6 mmol/L (ref 3.5–5.1)
Sodium: 140 mmol/L (ref 135–145)
Total Protein: 6.4 g/dL — ABNORMAL LOW (ref 6.5–8.1)

## 2018-05-19 LAB — CBC WITH DIFFERENTIAL/PLATELET
Abs Immature Granulocytes: 0 10*3/uL (ref 0.0–0.1)
BASOS ABS: 0 10*3/uL (ref 0.0–0.1)
BASOS PCT: 0 %
EOS ABS: 0.2 10*3/uL (ref 0.0–0.7)
EOS PCT: 2 %
HCT: 40.1 % (ref 36.0–46.0)
HEMOGLOBIN: 12.3 g/dL (ref 12.0–15.0)
Immature Granulocytes: 0 %
LYMPHS PCT: 23 %
Lymphs Abs: 2 10*3/uL (ref 0.7–4.0)
MCH: 26.9 pg (ref 26.0–34.0)
MCHC: 30.7 g/dL (ref 30.0–36.0)
MCV: 87.6 fL (ref 78.0–100.0)
MONO ABS: 0.6 10*3/uL (ref 0.1–1.0)
Monocytes Relative: 7 %
Neutro Abs: 5.6 10*3/uL (ref 1.7–7.7)
Neutrophils Relative %: 68 %
PLATELETS: 219 10*3/uL (ref 150–400)
RBC: 4.58 MIL/uL (ref 3.87–5.11)
RDW: 15.9 % — AB (ref 11.5–15.5)
WBC: 8.3 10*3/uL (ref 4.0–10.5)

## 2018-05-19 LAB — I-STAT CG4 LACTIC ACID, ED
LACTIC ACID, VENOUS: 2.14 mmol/L — AB (ref 0.5–1.9)
LACTIC ACID, VENOUS: 1.54 mmol/L (ref 0.5–1.9)

## 2018-05-19 LAB — BRAIN NATRIURETIC PEPTIDE: B NATRIURETIC PEPTIDE 5: 23.8 pg/mL (ref 0.0–100.0)

## 2018-05-19 MED ORDER — PROMETHAZINE-DM 6.25-15 MG/5ML PO SYRP: 5.0000 mL | ORAL_SOLUTION | Freq: Four times a day (QID) | ORAL | 0 refills | Status: DC | PRN

## 2018-05-19 MED ORDER — PROMETHAZINE-DM 6.25-15 MG/5ML PO SYRP: 5.0000 mL | ORAL_SOLUTION | Freq: Four times a day (QID) | ORAL | 0 refills | Status: AC | PRN

## 2018-05-19 MED ORDER — IOHEXOL 300 MG/ML  SOLN
100.0000 mL | Freq: Once | INTRAMUSCULAR | Status: AC | PRN
  Administered 2018-05-19: 100 mL via INTRAVENOUS

## 2018-05-19 MED ORDER — SODIUM CHLORIDE 0.9 % IV BOLUS
500.0000 mL | Freq: Once | INTRAVENOUS | Status: AC
  Administered 2018-05-19: 500 mL via INTRAVENOUS

## 2018-05-19 MED ORDER — FAMOTIDINE 40 MG PO TABS: 40.0000 mg | ORAL_TABLET | Freq: Every day | ORAL | 0 refills | Status: AC

## 2018-05-19 MED ORDER — FAMOTIDINE 40 MG PO TABS: 40.0000 mg | ORAL_TABLET | Freq: Every day | ORAL | 0 refills | Status: DC

## 2018-05-19 NOTE — ED Notes (Signed)
Pt returned from CT °

## 2018-05-19 NOTE — ED Notes (Signed)
Patient transported to CT 

## 2018-05-19 NOTE — Discharge Instructions (Signed)
Thank you for allowing me to care for you today in the Emergency Department.   Your respiratory panel is pending.  You may receive a call from the hospital if 1 of the results comes back as positive.  Continue to check your blood sugar at home.  Call to schedule a follow-up appointment with Dr. [place].  Your medications have been sent directly to your pharmacy.  Take 5 mL of Promethazine DM every 6 hours as needed for cough.  You can also swallow 1 teaspoon of honey every 6 hours to help with your cough.  You can also try taking 1 tablet of Pepcid daily for the next 2 weeks to see if your cough is associated with acid reflux/heartburn.  With viruses, sometimes a cough can be the last symptom that resolves.  Sometimes it can take at least 8 weeks after all or most of your other symptoms have resolved.  Return to the emergency department if you develop severe shortness of breath, chest pain, high fever, or other new, concerning symptoms.

## 2018-05-19 NOTE — ED Notes (Signed)
Patient given discharge instructions and verbalized understanding.  Patient stable to discharge at this time.  Patient is alert and oriented to baseline.  No distressed noted at this time.  All belongings taken with the patient at discharge.   

## 2018-05-19 NOTE — ED Provider Notes (Signed)
[facility] Provider Note   CSN: [id number] Arrival date & time: 05/19/18  1244     History   Chief Complaint No chief complaint on file.   HPI Michelle Rasmussen is a 52 y.o. female with a history tobacco use disorder, hyperlipidemia, diabetes mellitus type 2 who presents to the emergency department with a chief complaint of cough.  The patient endorses a productive cough with, a tickle in her throat, and right ear pain that began the third week of May. She states that her mucous initially "looked like I was coughing up pus.  The otalgia improved, but her voice began to go hoarse several weeks ago, which has persisted. She reports she did have a couple of episodes of vomiting a couple of weeks ago that resolved within one to two days.  Currently, she continues to endorse a cough with intermittently productive sputum, "a tickle in her throat", and a hoarse voice.   She reports that she was seen by her PCP approximately 2 weeks after her symptoms began and was told she had a viral illness and was d/ced to home with Tessalon Pearles.  Two weeks later, her symptoms didn't improve, so she was seen at [facility] in [place] and discharged to home with doxycycline. She completed the course with no improvement in her symptoms and returned to UC where she was given a burst of Prednisone and an albuterol inahler with no improvement. She returned to UC a third times and was give a Z-pac, which she completed 5 days ago and was told she may to follow up with ENT if her symptoms did not resolve.   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   The history is provided by the patient. No language interpreter was used.    Past Medical History:  Diagnosis Date  . Anemia   . Anemia   . 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 Depression   . Diabetes mellitus   . Dizzy spells    x2 years  . Hyperlipidemia   . OAB (overactive  bladder)   . Tobacco abuse     Patient Active Problem List   Diagnosis Date Noted  . Atypical chest pain 10/18/2011  . Positional lightheadedness 10/18/2011  . Diabetes mellitus 10/18/2011  . Dyslipidemia 10/18/2011  . Tobacco abuse 10/18/2011    Past Surgical History:  Procedure Laterality Date  . TONSILLECTOMY AND ADENOIDECTOMY       OB History   None      Home Medications    Prior to Admission medications   Medication Sig Start Date End Date Taking? Authorizing Provider  Canagliflozin (INVOKANA) 300 MG TABS 1 po QD 02/07/14   Kelby Aline, PA-C  citalopram (CELEXA) 40 MG tablet TAKE ONE TABLET BY MOUTH ONCE DAILY 04/04/14   Vicie Mutters, PA-C  Dapagliflozin Propanediol (FARXIGA) 10 MG TABS Take by mouth.    [provider]  famotidine (PEPCID) 40 MG tablet Take 1 tablet (40 mg total) by mouth daily for 14 days. 05/19/18 06/02/18  Min Tunnell A, PA-C  fluconazole (DIFLUCAN) 150 MG tablet Take 1 tablet (150 mg total) by mouth once a week. 02/07/14   Kelby Aline, PA-C  glipiZIDE (GLUCOTROL) 10 MG tablet TAKE ONE TABLET BY MOUTH TWICE DAILY BEFORE A MEAL 04/04/14   Vicie Mutters, PA-C  insulin detemir (LEVEMIR) 100 UNIT/ML injection Inject 20 Units into the skin at bedtime.  [provider]  promethazine-dextromethorphan (PROMETHAZINE-DM) 6.25-15 MG/5ML syrup Take 5 mLs by mouth 4 (four) times daily as needed for cough. 05/19/18   Kathryn Cosby A, PA-C  Saxagliptin-Metformin (KOMBIGLYZE XR) 2.02-999 MG TB24 Take 1 tablet by mouth 2 (two) times daily. 03/07/14   Kelby Aline, PA-C  solifenacin (VESICARE) 10 MG tablet Take 5 mg by mouth daily.      [provider]    Family History Family History  Problem Relation Age of Onset  . COPD Mother   . Hypertension Mother   . Diabetes Mother   . Schizophrenia Mother   . Heart attack Father   . ALS Father   . Coronary artery disease Father        with CABG x7  . Heart disease Father      Social History Social History   Tobacco Use  . Smoking status: Current Every Day Smoker    Packs/day: 1.00    Years: 22.00    Pack years: 22.00    Types: Cigarettes  . Smokeless tobacco: Never Used  Substance Use Topics  . Alcohol use: No  . Drug use: No     Allergies   Bupropion   Review of Systems Review of Systems  Constitutional: Negative for activity change, chills and fever.  HENT: Positive for ear pain (resolved), sore throat and voice change (hoarse). Negative for congestion, dental problem, sinus pressure, sinus pain and trouble swallowing.   Eyes: Negative for visual disturbance.  Respiratory: Positive for cough. Negative for choking, shortness of breath and wheezing.   Cardiovascular: Negative for chest pain, palpitations and leg swelling.  Gastrointestinal: Positive for nausea (intermittent) and vomiting (resolved). Negative for abdominal pain and diarrhea.  Genitourinary: Negative for dysuria.  Musculoskeletal: Negative for arthralgias, back pain, neck pain and neck stiffness.  Skin: Negative for rash and wound.  Allergic/Immunologic: Negative for immunocompromised state.  Neurological: Negative for dizziness, syncope, weakness, numbness and headaches.  Psychiatric/Behavioral: Negative for confusion.   Physical Exam Updated Vital Signs BP (!) 110/57   Pulse 79   Temp 97.8 F (36.6 C) (Oral)   Resp 18   SpO2 96%   Physical Exam  Constitutional: No distress.  HENT:  Head: Normocephalic.  Right Ear: Hearing and ear canal normal. Tympanic membrane is not erythematous and not bulging. A middle ear effusion is present.  Left Ear: Hearing and ear canal normal. Tympanic membrane is not erythematous and not bulging. A middle ear effusion is present.  Nose: Nose normal. No mucosal edema or rhinorrhea. Right sinus exhibits no maxillary sinus tenderness and no frontal sinus tenderness. Left sinus exhibits no maxillary sinus tenderness and no frontal sinus  tenderness.  Mouth/Throat: Uvula is midline and mucous membranes are normal. No oral lesions. No trismus in the jaw. No dental abscesses or uvula swelling. Posterior oropharyngeal erythema present. No oropharyngeal exudate, posterior oropharyngeal edema or tonsillar abscesses. No tonsillar exudate.  Voice is hoarse.  Eyes: Pupils are equal, round, and reactive to light. Conjunctivae and EOM are normal. No scleral icterus.  Neck: Normal range of motion. Neck supple. No JVD present. No tracheal deviation present. No thyromegaly present.  Cardiovascular: Normal rate, regular rhythm, normal heart sounds and intact distal pulses. Exam reveals no gallop and no friction rub.  No murmur heard. Pulmonary/Chest: Effort normal and breath sounds normal. No stridor. No respiratory distress. She has no wheezes. She has no rales. She exhibits no tenderness.  Infrequent, nonproductive cough observed that will last for a few  seconds at a time.   Abdominal: Soft. She exhibits no distension and no mass. There is no tenderness. There is no rebound and no guarding. No hernia.  Lymphadenopathy:    She has no cervical adenopathy.  Neurological: She is alert.  Skin: Skin is warm. No rash noted. She is not diaphoretic.  Psychiatric: Her behavior is normal.  Nursing note and vitals reviewed.  ED Treatments / Results  Labs (all labs ordered are listed, but only abnormal results are displayed) Labs Reviewed  COMPREHENSIVE METABOLIC PANEL - Abnormal; Notable for the following components:      Result Value   Glucose, Bld 253 (*)    Total Protein 6.4 (*)    All other components within normal limits  CBC WITH DIFFERENTIAL/PLATELET - Abnormal; Notable for the following components:   RDW 15.9 (*)    All other components within normal limits  I-STAT CG4 LACTIC ACID, ED - Abnormal; Notable for the following components:   Lactic Acid, Venous 2.14 (*)    All other components within normal limits  RESPIRATORY PANEL BY PCR    BRAIN NATRIURETIC PEPTIDE  I-STAT CG4 LACTIC ACID, ED    EKG None  Radiology Dg Chest 2 View  Result Date: 05/19/2018 CLINICAL DATA:  Two months of productive cough, or since, and fatigue unresponsive to treatment. Current smoker. EXAM: CHEST - 2 VIEW COMPARISON:  PA and lateral chest x-ray of April 02, 2012 FINDINGS: The lungs are adequately inflated. The interstitial markings are mildly prominent and more conspicuous than on the previous study. There is no alveolar infiltrate or pleural effusion. No pulmonary parenchymal nodules or masses are observed. The heart and pulmonary vascularity are normal. The mediastinum is normal in width. The bony thorax is unremarkable. IMPRESSION: Mild interstitial prominence bilaterally may reflect the patient's smoking history. No acute pneumonia nor CHF. Electronically Signed   By: David  Martinique M.D.   On: 05/19/2018 13:53   Ct Chest W Contrast  Result Date: 05/19/2018 CLINICAL DATA:  Persistent cough EXAM: CT CHEST WITH CONTRAST TECHNIQUE: Multidetector CT imaging of the chest was performed during intravenous contrast administration. CONTRAST:  157mL OMNIPAQUE IOHEXOL 300 MG/ML  SOLN COMPARISON:  Chest x-ray from earlier in the same day, CT of the abdomen from 11/17/2012 FINDINGS: Cardiovascular: Thoracic aorta is within normal limits. No aneurysmal dilatation or dissection is seen. Pulmonary artery as visualized shows no large central pulmonary embolism although timing was not performed for embolus evaluation. No significant coronary calcifications are seen. No cardiac enlargement is noted. Mediastinum/Nodes: Thoracic inlet is within normal limits. A few scattered small mediastinal lymph nodes are noted. The largest of these measures 11 mm in short axis lying just anterior to the carina. Few scattered small bilateral hilar lymph nodes are noted. These measure less than 1 cm in short axis. The esophagus is within normal limits. Lungs/Pleura: The lungs are well  aerated bilaterally. No focal infiltrate or sizable effusion is seen. No significant interstitial changes are noted Upper Abdomen: Rounded soft tissue lesion is noted within the left adrenal gland measuring 3.1 cm. This is slightly larger than that seen on prior CT from 2014 but again consistent with a left adrenal adenoma. Fatty infiltration of the liver is seen. Musculoskeletal: No acute bony abnormality is noted. Mild degenerative changes of thoracic spine are seen. IMPRESSION: No significant focal infiltrate or sizable effusion is noted. No significant interstitial change to correspond that on recent chest x-ray is noted. Small hilar and mediastinal lymph nodes likely of reactive  nature. Left adrenal lesion slightly larger than that seen in 2014 by approximately 6 mm again likely representing adenoma. Electronically Signed   By: Inez Catalina M.D.   On: 05/19/2018 17:49    Procedures Procedures (including critical care time)  Medications Ordered in ED Medications  sodium chloride 0.9 % bolus 500 mL (0 mLs Intravenous Stopped 05/19/18 1739)  iohexol (OMNIPAQUE) 300 MG/ML solution 100 mL (100 mLs Intravenous Contrast Given 05/19/18 1720)     Initial Impression / Assessment and Plan / ED Course  I have reviewed the triage vital signs and the nursing notes.  Pertinent labs & imaging results that were available during my care of the patient were reviewed by me and considered in my medical decision making (see chart for details).     52 year old female with a history tobacco use disorder, hyperlipidemia, diabetes mellitus type 2 presenting with chronic cough that began as a productive cough almost 2 months ago and is now nonproductive with intermittent mild, clear sputum.  She also endorses a hoarse voice and a "tickle in her throat".  Since her symptoms began almost 2 months ago, she is completed a course of doxycycline and azithromycin.  She is also a current, every day smoker and is  immunocompromised.  Her daily CBG runs in the 200s.  Initial lactate is 2.14.  Chest x-ray with mild interstitial prominence bilaterally. She reports that she has been eating and drinking well.  Given minimally elevated lactate her,  multiple rounds of antibiotics, and continued symptoms, CT chest ordered to assess for empyema or other underlying infection.  CT chest is negative for infiltrate or effusion.  There are small hilar and mediastinal lymph nodes that are likely reactive.  She also has a left adrenal lesion that is slightly larger by approximately 6 mm when last seen in 2014.  These findings were discussed with the patient.  She has no leukocytosis.  Glucose is 253 with normal anion gap.  Labs are otherwise reassuring.  She does have a history of GERD, which could contribute to the chronicity of her cough versus viral etiology with lingering cough given her hoarse voice.  The patient was discussed with Dr. Kathrynn Humble, attending physician.  Respiratory virus panel is pending.  Will discharge the patient to home with cough suppressant and Pepcid.  Recommended follow-up with her PCP as opposed to going to both her PCP and urgent care given the length of time that her symptom has been ongoing.  She is hemodynamically stable and in no acute distress.  She is safe for discharge home at this time with outpatient follow-up.  Final Clinical Impressions(s) / ED Diagnoses   Final diagnoses:  Chronic cough  Laryngitis    ED Discharge Orders        Ordered    promethazine-dextromethorphan (PROMETHAZINE-DM) 6.25-15 MG/5ML syrup  4 times daily PRN,   Status:  Discontinued     05/19/18 1852    famotidine (PEPCID) 40 MG tablet  Daily,   Status:  Discontinued     05/19/18 1852    famotidine (PEPCID) 40 MG tablet  Daily     05/19/18 1924    promethazine-dextromethorphan (PROMETHAZINE-DM) 6.25-15 MG/5ML syrup  4 times daily PRN     05/19/18 1924       Maeson Lourenco A, PA-C 05/20/18 Booker,  Ankit, MD 05/20/18 [phone]

## 2018-05-19 NOTE — ED Triage Notes (Signed)
Patient complains of productive cough, hoarse voice, and fatigue since May. Has been treated since May in [facility] multiple times for same. Has been prescribed tessalon, doxycycline, azithromycin, prednisone, and albuterol inhaler without resolution of symptoms. Denies shortness of breath. Patient alert, oriented, and in no apparent distress at this time.

## 2018-05-19 NOTE — ED Provider Notes (Signed)
Patient placed in Quick Look pathway, seen and evaluated   Chief Complaint: cough, throat irritation  HPI:  Pt reports she has had a cough and congestion since May  ROS: no fever, no chills  Physical Exam:   Gen: No distress  Neuro: Awake and Alert  Skin: Warm    Focused Exam: Lungs clear Heart rrr   Initiation of care has begun. The patient has been counseled on the process, plan, and necessity for staying for the completion/evaluation, and the remainder of the medical screening examination   Sidney Ace 05/19/18 1325    Julianne Rice, MD 05/21/18 1440

## 2018-05-20 LAB — RESPIRATORY PANEL BY PCR
Adenovirus: NOT DETECTED
BORDETELLA PERTUSSIS-RVPCR: NOT DETECTED
CORONAVIRUS OC43-RVPPCR: NOT DETECTED
Chlamydophila pneumoniae: NOT DETECTED
Coronavirus 229E: NOT DETECTED
Coronavirus HKU1: NOT DETECTED
Coronavirus NL63: NOT DETECTED
INFLUENZA A-RVPPCR: NOT DETECTED
INFLUENZA B-RVPPCR: NOT DETECTED
METAPNEUMOVIRUS-RVPPCR: NOT DETECTED
MYCOPLASMA PNEUMONIAE-RVPPCR: NOT DETECTED
PARAINFLUENZA VIRUS 1-RVPPCR: NOT DETECTED
PARAINFLUENZA VIRUS 4-RVPPCR: NOT DETECTED
Parainfluenza Virus 2: NOT DETECTED
Parainfluenza Virus 3: NOT DETECTED
RESPIRATORY SYNCYTIAL VIRUS-RVPPCR: NOT DETECTED
Rhinovirus / Enterovirus: NOT DETECTED

## 2019-07-08 DIAGNOSIS — M25561 Pain in right knee: Secondary | ICD-10-CM | POA: Insufficient documentation

## 2019-07-08 DIAGNOSIS — M170 Bilateral primary osteoarthritis of knee: Secondary | ICD-10-CM | POA: Insufficient documentation

## 2019-07-08 DIAGNOSIS — M159 Polyosteoarthritis, unspecified: Secondary | ICD-10-CM | POA: Insufficient documentation

## 2019-07-08 DIAGNOSIS — G8929 Other chronic pain: Secondary | ICD-10-CM | POA: Insufficient documentation

## 2019-07-08 DIAGNOSIS — G608 Other hereditary and idiopathic neuropathies: Secondary | ICD-10-CM | POA: Insufficient documentation

## 2019-07-08 DIAGNOSIS — Z791 Long term (current) use of non-steroidal anti-inflammatories (NSAID): Secondary | ICD-10-CM | POA: Insufficient documentation

## 2019-07-08 DIAGNOSIS — G894 Chronic pain syndrome: Secondary | ICD-10-CM | POA: Insufficient documentation

## 2019-07-08 DIAGNOSIS — G5603 Carpal tunnel syndrome, bilateral upper limbs: Secondary | ICD-10-CM | POA: Insufficient documentation

## 2019-07-08 DIAGNOSIS — M2550 Pain in unspecified joint: Secondary | ICD-10-CM | POA: Insufficient documentation

## 2020-04-22 IMAGING — CT CT CHEST W/ CM
2 of 7 series · 15 of 36 positions shown, 19 images · IV contrast (Omni 300)
Comparison: Chest x-ray from earlier in the same day, CT of the
abdomen from 11/17/2012

CLINICAL DATA: Persistent cough

EXAM:
CT CHEST WITH CONTRAST
TECHNIQUE: Multidetector CT imaging of the chest was performed during
intravenous contrast administration.
CONTRAST:  100mL OMNIPAQUE IOHEXOL 300 MG/ML  SOLN

[Series 4: chest with 2mm st · axial · 0.81mm/px · z∈[-129,+161]mm · 14 of 165 slices shown, 18 images]
[im 10/165  mediastinal]
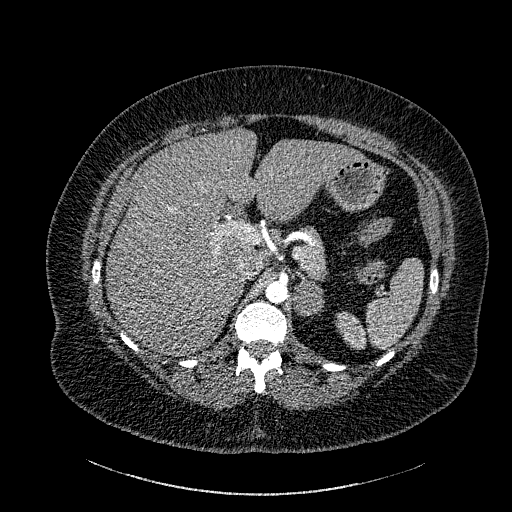
[im 10/165  lung]
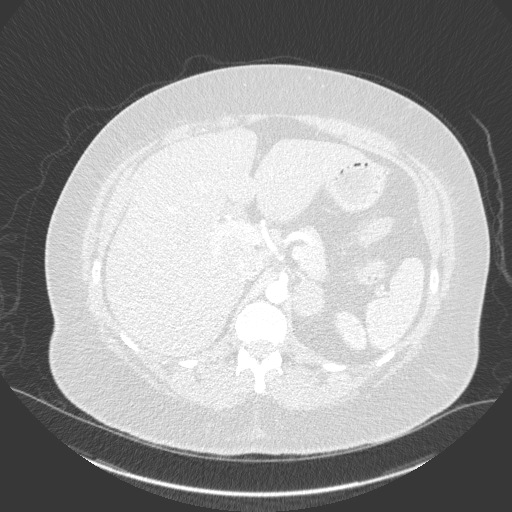
[im 19/165  lung]
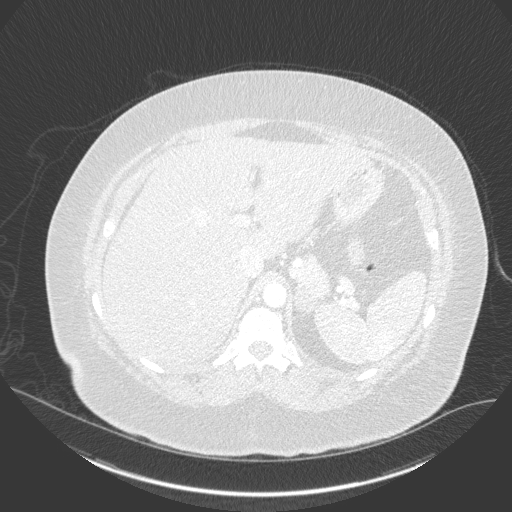
[im 37/165  lung]
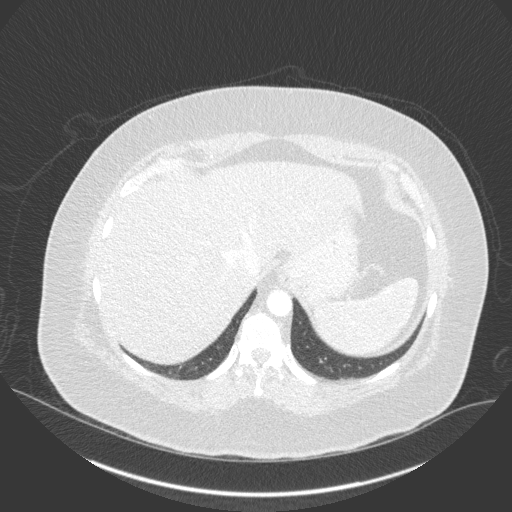
[im 46/165  lung]
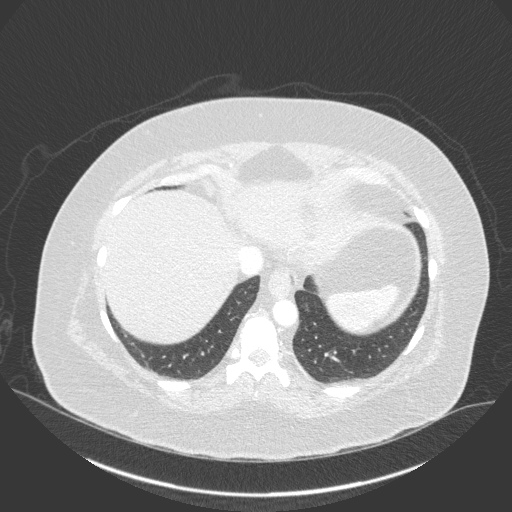
[im 55/165  mediastinal]
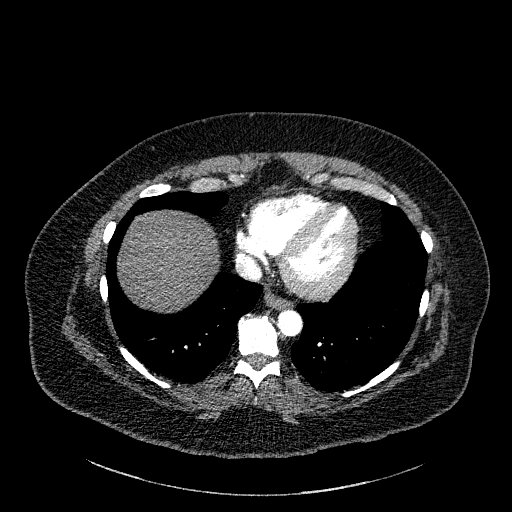
[im 55/165  lung]
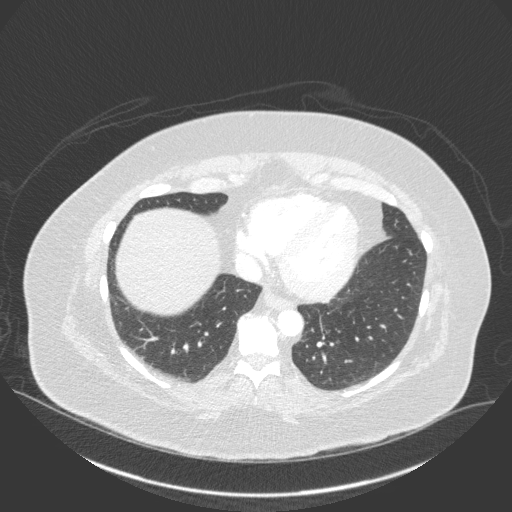
[im 64/165  lung]
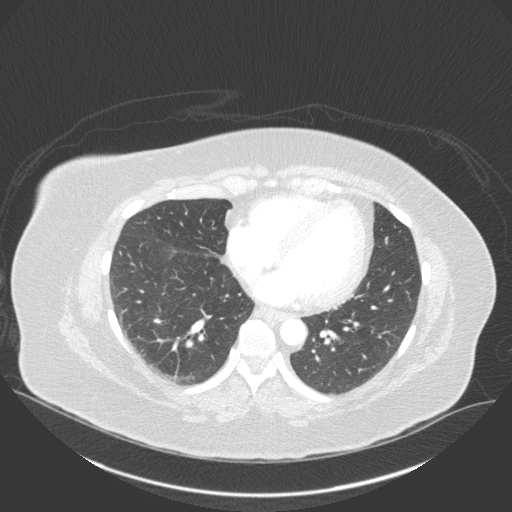
[im 73/165  lung]
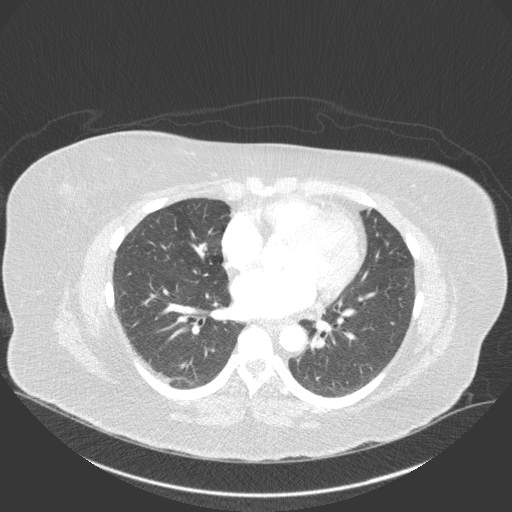
[im 92/165  lung]
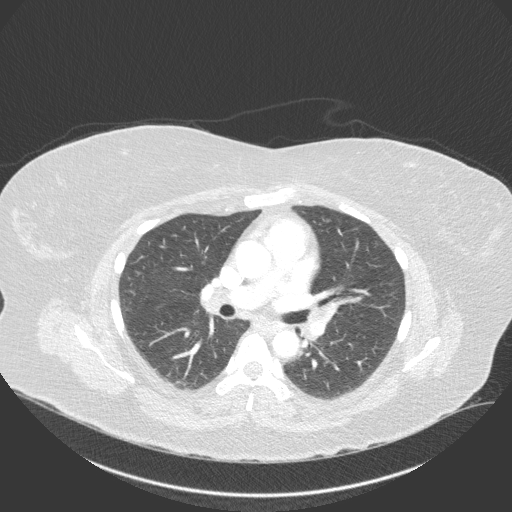
[im 101/165  mediastinal]
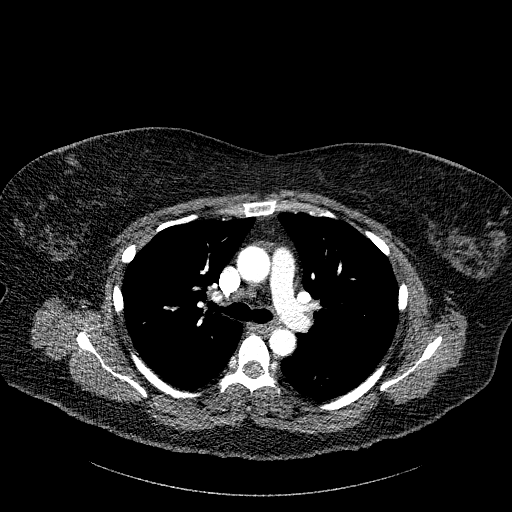
[im 101/165  lung]
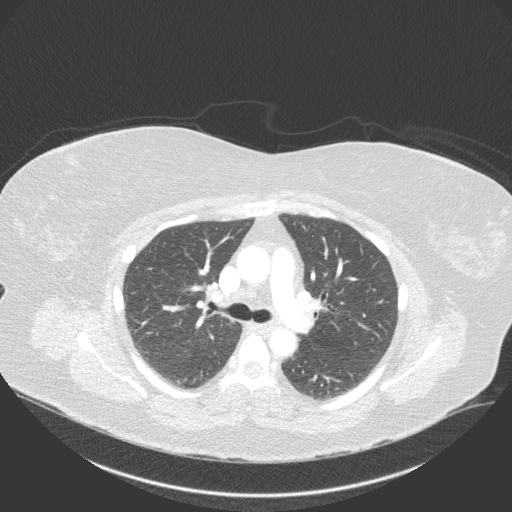
[im 110/165  lung]
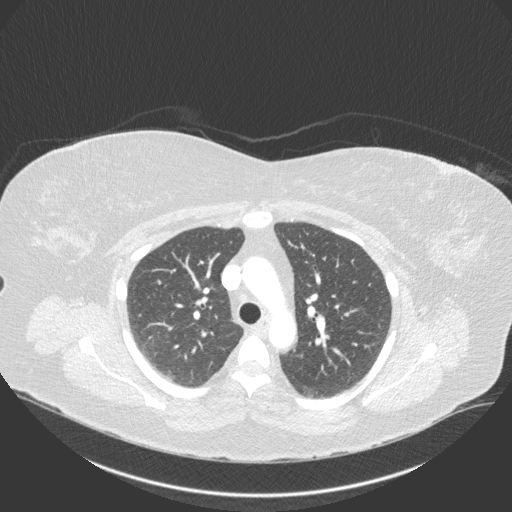
[im 119/165  lung]
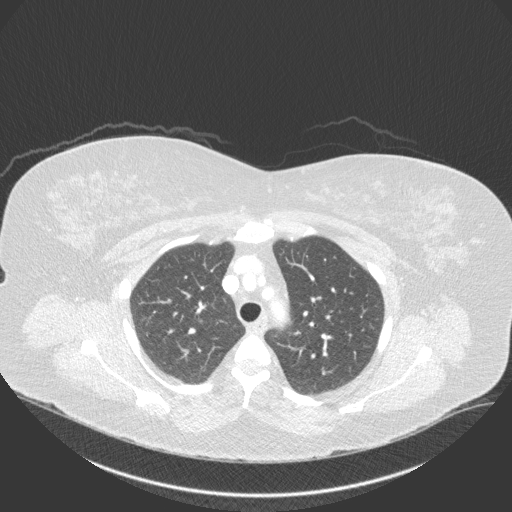
[im 128/165  lung]
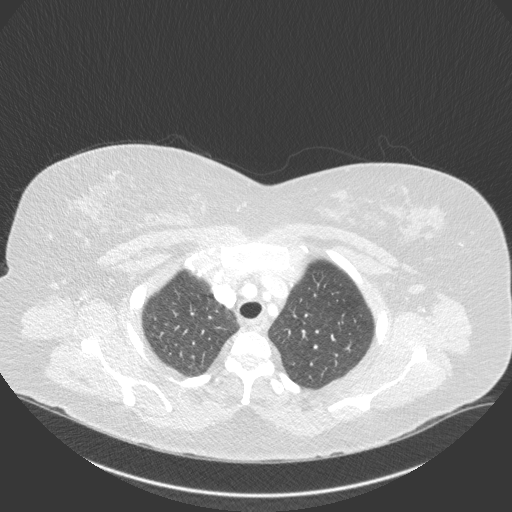
[im 146/165  mediastinal]
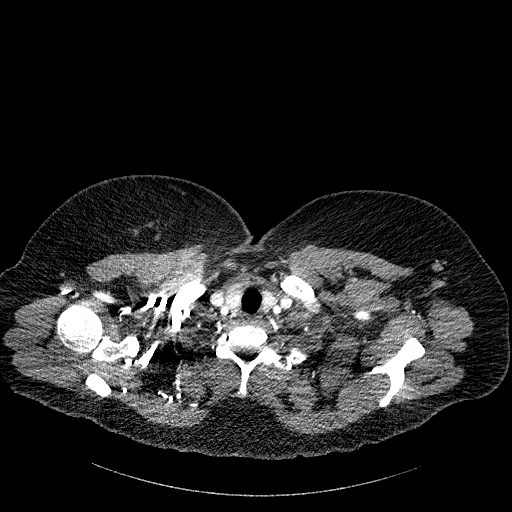
[im 146/165  lung]
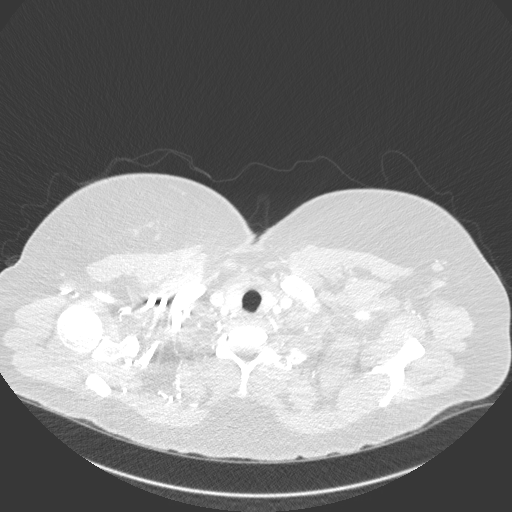
[im 155/165  lung]
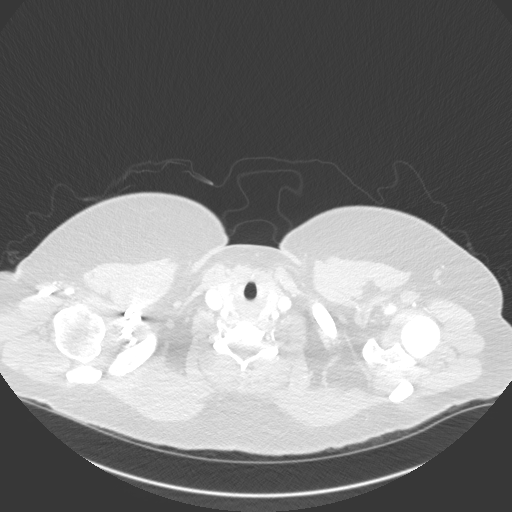

[Series 6: chest with 3mm st cor · coronal · 0.64mm/px · 1 of 101 slices shown]
[im 51/101  lung]
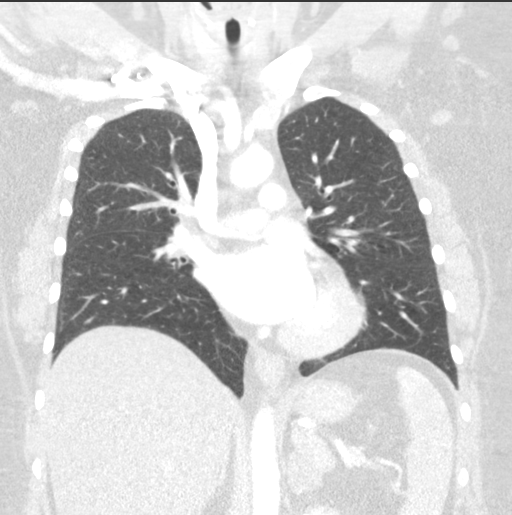

[15 of 36 positions shown; findings below may reference images not displayed]

FINDINGS: Cardiovascular: Thoracic aorta is within normal limits. No
aneurysmal dilatation or dissection is seen. Pulmonary artery as
visualized shows no large central pulmonary embolism although timing
was not performed for embolus evaluation. No significant coronary
calcifications are seen. No cardiac enlargement is noted.

Mediastinum/Nodes: Thoracic inlet is within normal limits. A few
scattered small mediastinal lymph nodes are noted. The largest of
these measures 11 mm in short axis lying just anterior to the
carina. Few scattered small bilateral hilar lymph nodes are noted.
These measure less than 1 cm in short axis. The esophagus is within
normal limits.

Lungs/Pleura: The lungs are well aerated bilaterally. No focal
infiltrate or sizable effusion is seen. No significant interstitial
changes are noted

Upper Abdomen: Rounded soft tissue lesion is noted within the left
adrenal gland measuring 3.1 cm. This is slightly larger than that
seen on prior CT from 4296 but again consistent with a left adrenal
adenoma. Fatty infiltration of the liver is seen.

Musculoskeletal: No acute bony abnormality is noted. Mild
degenerative changes of thoracic spine are seen.
IMPRESSION: No significant focal infiltrate or sizable effusion is noted. No
significant interstitial change to correspond that on recent chest
x-ray is noted.

Small hilar and mediastinal lymph nodes likely of reactive nature.

Left adrenal lesion slightly larger than that seen in 4296 by
approximately 6 mm again likely representing adenoma.

## 2021-09-07 ENCOUNTER — Encounter: Payer: Self-pay | Admitting: Sports Medicine

## 2021-09-07 ENCOUNTER — Other Ambulatory Visit: Payer: Self-pay

## 2021-09-07 ENCOUNTER — Ambulatory Visit: Payer: Medicare Other | Admitting: Sports Medicine

## 2021-09-07 DIAGNOSIS — M79674 Pain in right toe(s): Secondary | ICD-10-CM

## 2021-09-07 DIAGNOSIS — M79675 Pain in left toe(s): Secondary | ICD-10-CM

## 2021-09-07 DIAGNOSIS — L600 Ingrowing nail: Secondary | ICD-10-CM

## 2021-09-07 DIAGNOSIS — E1140 Type 2 diabetes mellitus with diabetic neuropathy, unspecified: Secondary | ICD-10-CM | POA: Diagnosis not present

## 2021-09-07 DIAGNOSIS — L603 Nail dystrophy: Secondary | ICD-10-CM | POA: Diagnosis not present

## 2021-09-07 MED ORDER — NEOMYCIN-POLYMYXIN-HC 1 % OT SOLN: OTIC | 0 refills | Status: AC

## 2021-09-07 NOTE — Progress Notes (Signed)
Subjective: Michelle Rasmussen is a 55 y.o. female patient seen today in office with complaint of mildly painful thickened and discolored nails. Patient is desiring treatment for nail changes and also reports that the nails grow in. Reports that she needs knee replacement and is wondering if this will help with feet. Quit smoking 2 years ago, Diabetic diagnosed 20 years ago, A1c 8.1.  Patient has no other pedal complaints at this time.   Patient Active Problem List   Diagnosis Date Noted   Chronic midline low back pain without sciatica 07/08/2019   Chronic pain syndrome 07/08/2019   Chronic pain of both knees 07/08/2019   Bilateral carpal tunnel syndrome 07/08/2019   Long term current use of non-steroidal anti-inflammatories (NSAID) 07/08/2019   Peripheral sensory neuropathy 07/08/2019   Polyarthralgia 07/08/2019   Primary osteoarthritis involving multiple joints 07/08/2019   Primary osteoarthritis of both knees 07/08/2019   Atypical chest pain 10/18/2011   Positional lightheadedness 10/18/2011   Diabetes mellitus 10/18/2011   Dyslipidemia 10/18/2011   Tobacco abuse 10/18/2011   Heartburn 05/29/2011   Hypercholesterolemia 05/29/2011   Hypertonicity of bladder 05/29/2011   Malaise and fatigue 05/29/2011    Current Outpatient Medications on File Prior to Visit  Medication Sig Dispense Refill   traZODone (DESYREL) 50 MG tablet Take by mouth.     Canagliflozin (INVOKANA) 300 MG TABS 1 po QD 30 tablet 0   citalopram (CELEXA) 40 MG tablet TAKE ONE TABLET BY MOUTH ONCE DAILY 30 tablet 1   Dapagliflozin Propanediol (FARXIGA) 10 MG TABS Take by mouth.     famotidine (PEPCID) 40 MG tablet Take 1 tablet (40 mg total) by mouth daily for 14 days. 14 tablet 0   fluconazole (DIFLUCAN) 150 MG tablet Take 1 tablet (150 mg total) by mouth once a week. 4 tablet 1   gabapentin (NEURONTIN) 600 MG tablet Take 1,200 mg by mouth 3 (three) times daily.     glipiZIDE (GLUCOTROL) 10 MG tablet TAKE ONE TABLET BY  MOUTH TWICE DAILY BEFORE A MEAL 60 tablet 1   HUMALOG 100 UNIT/ML injection Inject into the skin.     insulin detemir (LEVEMIR) 100 UNIT/ML injection Inject 20 Units into the skin at bedtime.      metFORMIN (GLUCOPHAGE) 1000 MG tablet Take 1,000 mg by mouth 2 (two) times daily.     oxybutynin (DITROPAN-XL) 10 MG 24 hr tablet Take 10 mg by mouth daily.     pantoprazole (PROTONIX) 40 MG tablet SMARTSIG:1 Tablet(s) By Mouth Morning-Evening     pioglitazone (ACTOS) 15 MG tablet Take 15 mg by mouth daily.     promethazine-dextromethorphan (PROMETHAZINE-DM) 6.25-15 MG/5ML syrup Take 5 mLs by mouth 4 (four) times daily as needed for cough. 118 mL 0   Saxagliptin-Metformin (KOMBIGLYZE XR) 2.02-999 MG TB24 Take 1 tablet by mouth 2 (two) times daily. 24 tablet 0   solifenacin (VESICARE) 10 MG tablet Take 5 mg by mouth daily.       triamcinolone ointment (KENALOG) 0.1 % Apply topically.     No current facility-administered medications on file prior to visit.    Allergies  Allergen Reactions   Bupropion Hives    Objective: Physical Exam  General: Well developed, nourished, no acute distress, awake, alert and oriented x 3  Vascular: Dorsalis pedis artery 2/4 bilateral, Posterior tibial artery 1/4 bilateral, skin temperature warm to warm proximal to distal bilateral lower extremities, no varicosities, pedal hair present bilateral.  Neurological: Gross sensation present via light touch bilateral.   Dermatological:  Skin is warm, dry, and supple bilateral, Nails 1-10 are tender, short thick, and discolored with mild subungal debris at left 1,5 and right 1,2,5, mild incurvation at 3rd toes bilateral with erythema at lateral nail folds, no webspace macerations present bilateral, no open lesions present bilateral, no callus/corns/hyperkeratotic tissue present bilateral. No signs of infection bilateral.  Musculoskeletal:Minimal hammertoe and bunion boney deformities noted bilateral. Muscular strength  within normal limits without painon range of motion. No pain with calf compression bilateral.  Assessment and Plan:  Problem List Items Addressed This Visit   None Visit Diagnoses     Nail dystrophy    -  Primary   Relevant Orders   Culture, fungus without smear   Toe pain, bilateral       Ingrown nail       Type 2 diabetes mellitus with diabetic neuropathy, unspecified whether long term insulin use (HCC)       Relevant Medications   HUMALOG 100 UNIT/ML injection   metFORMIN (GLUCOPHAGE) 1000 MG tablet   pioglitazone (ACTOS) 15 MG tablet       -Examined patient -Discussed treatment options for painful dystrophic nails  -Fungal culture was obtained by removing a portion of the hard nail itself from each of the involved toenails using a sterile nail nipper and sent to [facility] lab. Patient tolerated the biopsy procedure well without discomfort or need for anesthesia.  -Rx Corticosporin solution to use at 3rd toes bilateral. -Patient to return in 4 weeks for follow up evaluation and discussion of fungal culture results or sooner if symptoms worsen.  Landis Martins, DPM

## 2021-10-01 DIAGNOSIS — Z01818 Encounter for other preprocedural examination: Secondary | ICD-10-CM

## 2021-10-05 ENCOUNTER — Ambulatory Visit: Payer: Medicare Other | Admitting: Sports Medicine

## 2021-10-05 ENCOUNTER — Encounter: Payer: Self-pay | Admitting: Sports Medicine

## 2021-10-05 DIAGNOSIS — M79672 Pain in left foot: Secondary | ICD-10-CM

## 2021-10-05 DIAGNOSIS — L600 Ingrowing nail: Secondary | ICD-10-CM

## 2021-10-05 DIAGNOSIS — E1140 Type 2 diabetes mellitus with diabetic neuropathy, unspecified: Secondary | ICD-10-CM

## 2021-10-05 DIAGNOSIS — L603 Nail dystrophy: Secondary | ICD-10-CM | POA: Diagnosis not present

## 2021-10-05 DIAGNOSIS — M79674 Pain in right toe(s): Secondary | ICD-10-CM | POA: Diagnosis not present

## 2021-10-05 DIAGNOSIS — M79675 Pain in left toe(s): Secondary | ICD-10-CM

## 2021-10-05 DIAGNOSIS — M79671 Pain in right foot: Secondary | ICD-10-CM

## 2021-10-05 NOTE — Progress Notes (Signed)
Subjective: Michelle Rasmussen is a 55 y.o. female patient seen today in office for fungal culture results.  Patient has questions also about what she should do about her chronic Planter fasciitis states that she always have heel pain has had it for years.  Patient has no other pedal complaints at this time.   Fasting blood sugar not recorded  Patient Active Problem List   Diagnosis Date Noted   Chronic midline low back pain without sciatica 07/08/2019   Chronic pain syndrome 07/08/2019   Chronic pain of both knees 07/08/2019   Bilateral carpal tunnel syndrome 07/08/2019   Long term current use of non-steroidal anti-inflammatories (NSAID) 07/08/2019   Peripheral sensory neuropathy 07/08/2019   Polyarthralgia 07/08/2019   Primary osteoarthritis involving multiple joints 07/08/2019   Primary osteoarthritis of both knees 07/08/2019   Atypical chest pain 10/18/2011   Positional lightheadedness 10/18/2011   Diabetes mellitus 10/18/2011   Dyslipidemia 10/18/2011   Tobacco abuse 10/18/2011   Heartburn 05/29/2011   Hypercholesterolemia 05/29/2011   Hypertonicity of bladder 05/29/2011   Malaise and fatigue 05/29/2011    Current Outpatient Medications on File Prior to Visit  Medication Sig Dispense Refill   atorvastatin (LIPITOR) 20 MG tablet      Canagliflozin (INVOKANA) 300 MG TABS 1 po QD 30 tablet 0   citalopram (CELEXA) 40 MG tablet TAKE ONE TABLET BY MOUTH ONCE DAILY 30 tablet 1   Continuous Blood Gluc Sensor (FREESTYLE LIBRE 2 SENSOR) MISC See admin instructions.     Dapagliflozin Propanediol (FARXIGA) 10 MG TABS Take by mouth.     diclofenac Sodium (VOLTAREN) 1 % GEL SMARTSIG:2-4 Gram(s) Topical 4 Times Daily PRN     famotidine (PEPCID) 40 MG tablet Take 1 tablet (40 mg total) by mouth daily for 14 days. 14 tablet 0   fluconazole (DIFLUCAN) 150 MG tablet Take 1 tablet (150 mg total) by mouth once a week. 4 tablet 1   FLUoxetine (PROZAC) 40 MG capsule      gabapentin (NEURONTIN) 600 MG  tablet Take 1,200 mg by mouth 3 (three) times daily.     glipiZIDE (GLUCOTROL) 10 MG tablet TAKE ONE TABLET BY MOUTH TWICE DAILY BEFORE A MEAL 60 tablet 1   HUMALOG 100 UNIT/ML injection Inject into the skin.     insulin detemir (LEVEMIR) 100 UNIT/ML injection Inject 20 Units into the skin at bedtime.      Insulin Disposable Pump (V-GO 30) KIT See admin instructions.     losartan-hydrochlorothiazide (HYZAAR) 50-12.5 MG tablet      meloxicam (MOBIC) 15 MG tablet      metFORMIN (GLUCOPHAGE) 1000 MG tablet Take 1,000 mg by mouth 2 (two) times daily.     mupirocin ointment (BACTROBAN) 2 % SMARTSIG:1 Application Topical 2-3 Times Daily     NEOMYCIN-POLYMYXIN-HYDROCORTISONE (CORTISPORIN) 1 % SOLN OTIC solution Apply 1-2 drops to toes once daily at bedtime 10 mL 0   oxybutynin (DITROPAN-XL) 10 MG 24 hr tablet Take 10 mg by mouth daily.     pantoprazole (PROTONIX) 40 MG tablet SMARTSIG:1 Tablet(s) By Mouth Morning-Evening     phentermine (ADIPEX-P) 37.5 MG tablet      pioglitazone (ACTOS) 15 MG tablet Take 15 mg by mouth daily.     promethazine-dextromethorphan (PROMETHAZINE-DM) 6.25-15 MG/5ML syrup Take 5 mLs by mouth 4 (four) times daily as needed for cough. 118 mL 0   Saxagliptin-Metformin (KOMBIGLYZE XR) 2.02-999 MG TB24 Take 1 tablet by mouth 2 (two) times daily. 24 tablet 0   solifenacin (VESICARE) 10  MG tablet Take 5 mg by mouth daily.       traZODone (DESYREL) 50 MG tablet Take by mouth.     triamcinolone ointment (KENALOG) 0.1 % Apply topically.     No current facility-administered medications on file prior to visit.    Allergies  Allergen Reactions   Bupropion Hives    Objective: Physical Exam  General: Well developed, nourished, no acute distress, awake, alert and oriented x 3  Vascular: Dorsalis pedis artery 2/4 bilateral, Posterior tibial artery 1/4 bilateral, skin temperature warm to warm proximal to distal bilateral lower extremities, no varicosities, pedal hair present  bilateral.  Neurological: Gross sensation present via light touch bilateral.   Dermatological: Skin is warm, dry, and supple bilateral, Nails 1-10 are tender, short thick, and discolored with mild subungal debris and minimal incurvation noted to bilateral third toes lateral margins, no webspace macerations present bilateral, no open lesions present bilateral, no callus/corns/hyperkeratotic tissue present bilateral. No signs of infection bilateral.  Musculoskeletal: Chronic heel pain however at this time the heel was not thoroughly assessment this is a secondary complaint.   Fungal culture + saprophytic fungi and microtrauma  Assessment and Plan:  Problem List Items Addressed This Visit   None Visit Diagnoses     Nail dystrophy    -  Primary   Toe pain, bilateral       Ingrown nail       Type 2 diabetes mellitus with diabetic neuropathy, unspecified whether long term insulin use (HCC)       Relevant Medications   atorvastatin (LIPITOR) 20 MG tablet   losartan-hydrochlorothiazide (HYZAAR) 50-12.5 MG tablet       -Examined patient -Discussed treatment options for painful mycotic nails -Patient opt for oral Lamisil with full understanding of medication risks; ordered LFTs for review if within normal limits will proceed with sending Rx to pharmacy for lamisil 221m PO daily. Anticipate 12 week course.  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 before she consider starting on Lamisil once I send it to her pharmacy if her blood work is normal -At no additional charge mechanically debrided all nails and advised patient that I cannot do this every month and that the insurance will only cover about every 3 months for her nails to be trimmed at this time I did it for her as a courtesy -Advised patient that if she is having heel pain she should try heel lifts as I dispensed this  visit gentle stretching and icing since this is a secondary complaint not the primary reason for her follow-up visit -Patient to return as needed or sooner if symptoms worsen.  TLandis Martins DPM

## 2022-01-16 ENCOUNTER — Ambulatory Visit: Payer: Medicare Other | Admitting: Sports Medicine

## 2022-01-16 ENCOUNTER — Other Ambulatory Visit: Payer: Self-pay

## 2022-01-16 ENCOUNTER — Encounter: Payer: Self-pay | Admitting: Sports Medicine

## 2022-01-16 DIAGNOSIS — M79673 Pain in unspecified foot: Secondary | ICD-10-CM | POA: Diagnosis not present

## 2022-01-16 DIAGNOSIS — M5416 Radiculopathy, lumbar region: Secondary | ICD-10-CM | POA: Diagnosis not present

## 2022-01-16 DIAGNOSIS — M792 Neuralgia and neuritis, unspecified: Secondary | ICD-10-CM

## 2022-01-16 DIAGNOSIS — G894 Chronic pain syndrome: Secondary | ICD-10-CM | POA: Diagnosis not present

## 2022-01-16 DIAGNOSIS — M79672 Pain in left foot: Secondary | ICD-10-CM

## 2022-01-16 DIAGNOSIS — E1140 Type 2 diabetes mellitus with diabetic neuropathy, unspecified: Secondary | ICD-10-CM | POA: Diagnosis not present

## 2022-01-16 NOTE — Progress Notes (Signed)
Subjective: ?Michelle Rasmussen is a 56 y.o. female patient who presents to office for evaluation of left foot pain.  Patient reports that she was having terrible pain and was seen by Dr. Samule Dry who x-rayed it and could not find anything and thought her pain was more related to neuropathy.  Patient reports that her pain is now better after she got a back injection done by her pain doctor and states that the only symptom that she has is occasional numbness of the left fourth and fifth toes however the pain that she previously had is now resolved. ? ?Patient admits that she was really worried about her foot because family friend thought she might of been developing Charcot. ? ?Patient Active Problem List  ? Diagnosis Date Noted  ? Chronic midline low back pain without sciatica 07/08/2019  ? Chronic pain syndrome 07/08/2019  ? Chronic pain of both knees 07/08/2019  ? Bilateral carpal tunnel syndrome 07/08/2019  ? Long term current use of non-steroidal anti-inflammatories (NSAID) 07/08/2019  ? Peripheral sensory neuropathy 07/08/2019  ? Polyarthralgia 07/08/2019  ? Primary osteoarthritis involving multiple joints 07/08/2019  ? Primary osteoarthritis of both knees 07/08/2019  ? Atypical chest pain 10/18/2011  ? Positional lightheadedness 10/18/2011  ? Diabetes mellitus 10/18/2011  ? Dyslipidemia 10/18/2011  ? Tobacco abuse 10/18/2011  ? Heartburn 05/29/2011  ? Hypercholesterolemia 05/29/2011  ? 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 Malaise and fatigue 05/29/2011  ? ? ?Current Outpatient Medications on File Prior to Visit  ?Medication Sig Dispense Refill  ? atorvastatin (LIPITOR) 20 MG tablet     ? Canagliflozin (INVOKANA) 300 MG TABS 1 po QD 30 tablet 0  ? celecoxib (CELEBREX) 100 MG capsule Take 100 mg by mouth 2 (two) times daily.    ? citalopram (CELEXA) 40 MG tablet TAKE ONE TABLET BY MOUTH ONCE DAILY 30 tablet 1  ? Continuous Blood Gluc Sensor (FREESTYLE LIBRE 2 SENSOR) MISC See admin instructions.    ? Dapagliflozin  Propanediol (FARXIGA) 10 MG TABS Take by mouth.    ? diclofenac Sodium (VOLTAREN) 1 % GEL SMARTSIG:2-4 Gram(s) Topical 4 Times Daily PRN    ? famotidine (PEPCID) 40 MG tablet Take 1 tablet (40 mg total) by mouth daily for 14 days. 14 tablet 0  ? fluconazole (DIFLUCAN) 150 MG tablet Take 1 tablet (150 mg total) by mouth once a week. 4 tablet 1  ? FLUoxetine (PROZAC) 20 MG capsule Take 60 mg by mouth every morning.    ? FLUoxetine (PROZAC) 40 MG capsule     ? gabapentin (NEURONTIN) 600 MG tablet Take 1,200 mg by mouth 3 (three) times daily.    ? glipiZIDE (GLUCOTROL) 10 MG tablet TAKE ONE TABLET BY MOUTH TWICE DAILY BEFORE A MEAL 60 tablet 1  ? HUMALOG 100 UNIT/ML injection Inject into the skin.    ? hydrOXYzine (ATARAX) 25 MG tablet Take 25 mg by mouth 2 (two) times daily.    ? insulin detemir (LEVEMIR) 100 UNIT/ML injection Inject 20 Units into the skin at bedtime.     ? Insulin Disposable Pump (V-GO 30) KIT See admin instructions.    ? losartan-hydrochlorothiazide (HYZAAR) 50-12.5 MG tablet     ? meloxicam (MOBIC) 15 MG tablet     ? metFORMIN (GLUCOPHAGE) 1000 MG tablet Take 1,000 mg by mouth 2 (two) times daily.    ? mupirocin ointment (BACTROBAN) 2 % SMARTSIG:1 Application Topical 2-3 Times Daily    ? NEOMYCIN-POLYMYXIN-HYDROCORTISONE (CORTISPORIN) 1 % SOLN OTIC solution Apply 1-2 drops to  toes once daily at bedtime 10 mL 0  ? ondansetron (ZOFRAN) 8 MG tablet Take 8 mg by mouth every 8 (eight) hours as needed.    ? oxybutynin (DITROPAN-XL) 10 MG 24 hr tablet Take 10 mg by mouth daily.    ? pantoprazole (PROTONIX) 40 MG tablet SMARTSIG:1 Tablet(s) By Mouth Morning-Evening    ? phentermine (ADIPEX-P) 37.5 MG tablet     ? pioglitazone (ACTOS) 15 MG tablet Take 15 mg by mouth daily.    ? pregabalin (LYRICA) 100 MG capsule Take 100 mg by mouth 2 (two) times daily.    ? promethazine-dextromethorphan (PROMETHAZINE-DM) 6.25-15 MG/5ML syrup Take 5 mLs by mouth 4 (four) times daily as needed for cough. 118 mL 0  ?  Saxagliptin-Metformin (KOMBIGLYZE XR) 2.02-999 MG TB24 Take 1 tablet by mouth 2 (two) times daily. 24 tablet 0  ? solifenacin (VESICARE) 10 MG tablet Take 5 mg by mouth daily.      ? traZODone (DESYREL) 50 MG tablet Take by mouth.    ? triamcinolone ointment (KENALOG) 0.1 % Apply topically.    ? ?No current facility-administered medications on file prior to visit.  ? ? ?Allergies  ?Allergen Reactions  ? Bupropion Hives  ? ? ?Objective:  ?General: Alert and oriented x3 in no acute distress ? ?Dermatology: No open lesions bilateral lower extremities, no webspace macerations, no ecchymosis bilateral, all nails x 10 are well manicured. ? ?Vascular: Dorsalis Pedis and Posterior Tibial pedal pulses palpable, Capillary Fill Time 3 seconds,(+) pedal hair growth bilateral, no edema bilateral lower extremities, Temperature gradient within normal limits. ? ?Neurology: Gross sensation intact via light touch bilateral, vibratory sensation slightly diminished bilateral.  However protective sensation intact with Sumycin monofilament bilateral. ? ?Musculoskeletal: No reproducible tenderness palpation on the left foot.  No clinical signs of acute Charcot at this time. ? ? ?Assessment and Plan: ?Problem List Items Addressed This Visit   ? ?  ? Other  ? Chronic pain syndrome  ? Relevant Medications  ? celecoxib (CELEBREX) 100 MG capsule  ? FLUoxetine (PROZAC) 20 MG capsule  ? pregabalin (LYRICA) 100 MG capsule  ? ?Other Visit Diagnoses   ? ? Neuritis    -  Primary  ? Lumbar radiculopathy      ? Relevant Medications  ? FLUoxetine (PROZAC) 20 MG capsule  ? hydrOXYzine (ATARAX) 25 MG tablet  ? pregabalin (LYRICA) 100 MG capsule  ? Type 2 diabetes mellitus with diabetic neuropathy, unspecified whether long term insulin use ([place])      ? ?  ?  ? ?-Complete examination performed ?-Xrays not done this visit since left foot pain has resolved ?-Discussed continued care for neuropathy which I believe is strictly related to her back since her  foot pain resolved after she got her back injection ?-Advised patient to continue with neuropathy medications as previously prescribed by her other providers ?-Advised patient to continue with good supportive shoes daily ?-Reassured patient that there are no clinical signs of Charcot at this time ?-Patient to return to office as needed or sooner if condition worsens. ? ?Landis Martins, DPM ? ?

## 2022-02-22 ENCOUNTER — Other Ambulatory Visit: Payer: Self-pay | Admitting: Sports Medicine

## 2022-02-22 MED ORDER — SULFAMETHOXAZOLE-TRIMETHOPRIM 400-80 MG PO TABS: 1.0000 | ORAL_TABLET | Freq: Two times a day (BID) | ORAL | 0 refills | Status: DC

## 2022-02-22 MED ORDER — SULFAMETHOXAZOLE-TRIMETHOPRIM 400-80 MG PO TABS: 1.0000 | ORAL_TABLET | Freq: Two times a day (BID) | ORAL | 0 refills | Status: AC

## 2022-02-22 NOTE — Progress Notes (Signed)
Sent bactrim antibiotic for possible infection at the tip of toe/nail ?Until she can be seen as scheduled on Tuesday ?

## 2022-02-22 NOTE — Progress Notes (Signed)
Changed Rx to the correct pharmacy ?

## 2022-02-26 ENCOUNTER — Ambulatory Visit (INDEPENDENT_AMBULATORY_CARE_PROVIDER_SITE_OTHER): Payer: Medicare Other

## 2022-02-26 ENCOUNTER — Encounter: Payer: Self-pay | Admitting: Sports Medicine

## 2022-02-26 ENCOUNTER — Ambulatory Visit: Payer: Medicare Other | Admitting: Sports Medicine

## 2022-02-26 DIAGNOSIS — M2062 Acquired deformities of toe(s), unspecified, left foot: Secondary | ICD-10-CM | POA: Diagnosis not present

## 2022-02-26 DIAGNOSIS — E1140 Type 2 diabetes mellitus with diabetic neuropathy, unspecified: Secondary | ICD-10-CM | POA: Diagnosis not present

## 2022-02-26 DIAGNOSIS — T6591XA Toxic effect of unspecified substance, accidental (unintentional), initial encounter: Secondary | ICD-10-CM

## 2022-02-26 DIAGNOSIS — G894 Chronic pain syndrome: Secondary | ICD-10-CM

## 2022-02-26 DIAGNOSIS — M79675 Pain in left toe(s): Secondary | ICD-10-CM

## 2022-02-26 DIAGNOSIS — M5416 Radiculopathy, lumbar region: Secondary | ICD-10-CM

## 2022-02-26 DIAGNOSIS — L601 Onycholysis: Secondary | ICD-10-CM

## 2022-02-26 NOTE — Progress Notes (Signed)
Subjective: ?Michelle Rasmussen is a 56 y.o. female patient with history of diabetes who presents to office today complaining of pus and drainage from the left second toe noticed that last Friday states that she used some over-the-counter antifungal powder and cream and then afterwards the nail lifted up and she became concerned because she thought she was getting a diabetic ulcer and having infection.  Patient denies any other pedal complaints at this time or constitutional symptoms she does report that she did pick up the antibiotic and it seems to be helping some but did notice some itching however patient does admit that she has a history of frequent itching anyway and reports that she has small scratches on her back from itching so much.  No other pedal complaints noted. ? ?Patient Active Problem List  ? Diagnosis Date Noted  ? Chronic midline low back pain without sciatica 07/08/2019  ? Chronic pain syndrome 07/08/2019  ? Chronic pain of both knees 07/08/2019  ? Bilateral carpal tunnel syndrome 07/08/2019  ? Long term current use of non-steroidal anti-inflammatories (NSAID) 07/08/2019  ? Peripheral sensory neuropathy 07/08/2019  ? Polyarthralgia 07/08/2019  ? Primary osteoarthritis involving multiple joints 07/08/2019  ? Primary osteoarthritis of both knees 07/08/2019  ? Atypical chest pain 10/18/2011  ? Positional lightheadedness 10/18/2011  ? Diabetes mellitus 10/18/2011  ? Dyslipidemia 10/18/2011  ? Tobacco abuse 10/18/2011  ? Heartburn 05/29/2011  ? Hypercholesterolemia 05/29/2011  ? 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 Malaise and fatigue 05/29/2011  ? ?Current Outpatient Medications on File Prior to Visit  ?Medication Sig Dispense Refill  ? atorvastatin (LIPITOR) 20 MG tablet     ? Canagliflozin (INVOKANA) 300 MG TABS 1 po QD 30 tablet 0  ? celecoxib (CELEBREX) 100 MG capsule Take 100 mg by mouth 2 (two) times daily.    ? chlorhexidine (PERIDEX) 0.12 % solution SMARTSIG:By Mouth    ? citalopram (CELEXA) 40  MG tablet TAKE ONE TABLET BY MOUTH ONCE DAILY 30 tablet 1  ? Continuous Blood Gluc Sensor (FREESTYLE LIBRE 2 SENSOR) MISC See admin instructions.    ? Dapagliflozin Propanediol (FARXIGA) 10 MG TABS Take by mouth.    ? diclofenac Sodium (VOLTAREN) 1 % GEL SMARTSIG:2-4 Gram(s) Topical 4 Times Daily PRN    ? DULoxetine (CYMBALTA) 30 MG capsule Take by mouth.    ? famotidine (PEPCID) 40 MG tablet Take 1 tablet (40 mg total) by mouth daily for 14 days. 14 tablet 0  ? fluconazole (DIFLUCAN) 150 MG tablet Take 1 tablet (150 mg total) by mouth once a week. 4 tablet 1  ? FLUoxetine (PROZAC) 20 MG capsule Take 60 mg by mouth every morning.    ? FLUoxetine (PROZAC) 40 MG capsule     ? gabapentin (NEURONTIN) 600 MG tablet Take 1,200 mg by mouth 3 (three) times daily.    ? glipiZIDE (GLUCOTROL) 10 MG tablet TAKE ONE TABLET BY MOUTH TWICE DAILY BEFORE A MEAL 60 tablet 1  ? HUMALOG 100 UNIT/ML injection Inject into the skin.    ? HYDROcodone-acetaminophen (NORCO/VICODIN) 5-325 MG tablet Take 1 tablet by mouth every 4 (four) hours as needed.    ? hydrOXYzine (ATARAX) 25 MG tablet Take 25 mg by mouth 2 (two) times daily.    ? hydrOXYzine (ATARAX) 50 MG tablet Take 50 mg by mouth at bedtime.    ? insulin detemir (LEVEMIR) 100 UNIT/ML injection Inject 20 Units into the skin at bedtime.     ? Insulin Disposable Pump (V-GO 30) KIT See  admin instructions.    ? losartan-hydrochlorothiazide (HYZAAR) 50-12.5 MG tablet     ? meloxicam (MOBIC) 15 MG tablet     ? metFORMIN (GLUCOPHAGE) 1000 MG tablet Take 1,000 mg by mouth 2 (two) times daily.    ? mupirocin ointment (BACTROBAN) 2 % SMARTSIG:1 Application Topical 2-3 Times Daily    ? NEOMYCIN-POLYMYXIN-HYDROCORTISONE (CORTISPORIN) 1 % SOLN OTIC solution Apply 1-2 drops to toes once daily at bedtime 10 mL 0  ? ondansetron (ZOFRAN) 8 MG tablet Take 8 mg by mouth every 8 (eight) hours as needed.    ? oxybutynin (DITROPAN-XL) 10 MG 24 hr tablet Take 10 mg by mouth daily.    ? pantoprazole  (PROTONIX) 40 MG tablet SMARTSIG:1 Tablet(s) By Mouth Morning-Evening    ? phentermine (ADIPEX-P) 37.5 MG tablet     ? pioglitazone (ACTOS) 15 MG tablet Take 15 mg by mouth daily.    ? pregabalin (LYRICA) 100 MG capsule Take 100 mg by mouth 2 (two) times daily.    ? promethazine-dextromethorphan (PROMETHAZINE-DM) 6.25-15 MG/5ML syrup Take 5 mLs by mouth 4 (four) times daily as needed for cough. 118 mL 0  ? Saxagliptin-Metformin (KOMBIGLYZE XR) 2.02-999 MG TB24 Take 1 tablet by mouth 2 (two) times daily. 24 tablet 0  ? solifenacin (VESICARE) 10 MG tablet Take 5 mg by mouth daily.      ? sulfamethoxazole-trimethoprim (BACTRIM) 400-80 MG tablet Take 1 tablet by mouth 2 (two) times daily. 28 tablet 0  ? traZODone (DESYREL) 50 MG tablet Take by mouth.    ? triamcinolone ointment (KENALOG) 0.1 % Apply topically.    ? ?No current facility-administered medications on file prior to visit.  ? ?Allergies  ?Allergen Reactions  ? Bupropion Hives  ? ? ?No results found for this or any previous visit (from the past 2160 hour(s)). ? ?Objective: ?General: Patient is awake, alert, and oriented x 3 and in no acute distress. ? ?Integument: Skin is warm, dry and supple bilateral. Nails are short there is distal lifting of the left second toenail wants this nail was gently debrided off there was a 100% granular nail bed noted with minimal erythema no edema and no active drainage no malodor no warmth at the left second toe.  All other nails are short and well manicured.  Minimal bruise noted to the distal tip of the left second toe that is blanchable of unknown etiology however does not present to be a concern for an ulcer.  Remaining integument unremarkable. ? ?Vasculature:  Dorsalis Pedis pulse 2/4 bilateral. Posterior Tibial pulse 1/4 bilateral.  ?Capillary fill time <3 sec 1-5 bilateral. Positive hair growth to the level of the digits. ?Temperature gradient within normal limits. No varicosities present bilateral. No edema present  bilateral.  ? ?Neurology: Patient is very hypersensitive to touch has a history of neuropathy secondary to lumbar radiculopathy and diabetes. ? ?Musculoskeletal: Pain to palpation to left second toe.  Long toe. ? ?X-ray left foot no acute fracture or dislocation no acute bony erosions no gas in soft tissues no acute concern at the left second toe ? ?Assessment and Plan: ?Problem List Items Addressed This Visit   ? ?  ? Other  ? Chronic pain syndrome  ? Relevant Medications  ? DULoxetine (CYMBALTA) 30 MG capsule  ? HYDROcodone-acetaminophen (NORCO/VICODIN) 5-325 MG tablet  ? ?Other Visit Diagnoses   ? ? Acquired deformity of left toe    -  Primary  ? Relevant Orders  ? DG Foot Complete Left  ?  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 Toe pain, left      ? Type 2 diabetes mellitus with diabetic neuropathy, unspecified whether long term insulin use ([place])      ? Lumbar radiculopathy      ? Relevant Medications  ? DULoxetine (CYMBALTA) 30 MG capsule  ? hydrOXYzine (ATARAX) 50 MG tablet  ? ?  ? ? ?-Examined patient. ?-Discussed with patient likely onycholysis due to the use of ketoconazole to the nail which likely made it soft and caused it to lift up ?-Mechanically debrided left second toenail using sterile nail nipper and applied Betadine and Band-Aid dressing and advised patient to do the same until the skin at the nailbed has healed ?-Advised patient to continue with Bactrim antibiotic until completed and advised her if itchy sensation worsen to take her hydroxyzine ?-Continue with chronic pain medications for pain of toe ?-Answered all patient questions ?-Patient to return as needed or sooner if problems or issues arise ?-Patient advised to call the office if any problems or questions arise in the meantime. ? ?Landis Martins, DPM ? ?

## 2022-05-13 DIAGNOSIS — R0600 Dyspnea, unspecified: Secondary | ICD-10-CM | POA: Diagnosis not present

## 2022-05-13 DIAGNOSIS — G4733 Obstructive sleep apnea (adult) (pediatric): Secondary | ICD-10-CM | POA: Diagnosis not present

## 2022-05-13 DIAGNOSIS — R5383 Other fatigue: Secondary | ICD-10-CM | POA: Diagnosis not present

## 2022-05-13 DIAGNOSIS — Z87891 Personal history of nicotine dependence: Secondary | ICD-10-CM | POA: Diagnosis not present

## 2022-05-17 DIAGNOSIS — F332 Major depressive disorder, recurrent severe without psychotic features: Secondary | ICD-10-CM | POA: Diagnosis not present

## 2022-05-23 DIAGNOSIS — M5430 Sciatica, unspecified side: Secondary | ICD-10-CM | POA: Diagnosis not present

## 2022-05-23 DIAGNOSIS — M47816 Spondylosis without myelopathy or radiculopathy, lumbar region: Secondary | ICD-10-CM | POA: Diagnosis not present

## 2022-05-23 DIAGNOSIS — M179 Osteoarthritis of knee, unspecified: Secondary | ICD-10-CM | POA: Diagnosis not present

## 2022-05-23 DIAGNOSIS — Z1389 Encounter for screening for other disorder: Secondary | ICD-10-CM | POA: Diagnosis not present

## 2022-05-24 DIAGNOSIS — E669 Obesity, unspecified: Secondary | ICD-10-CM | POA: Diagnosis not present

## 2022-05-24 DIAGNOSIS — G4700 Insomnia, unspecified: Secondary | ICD-10-CM | POA: Diagnosis not present

## 2022-05-24 DIAGNOSIS — R509 Fever, unspecified: Secondary | ICD-10-CM | POA: Diagnosis not present

## 2022-05-24 DIAGNOSIS — Z046 Encounter for general psychiatric examination, requested by authority: Secondary | ICD-10-CM | POA: Diagnosis not present

## 2022-05-24 DIAGNOSIS — F32A Depression, unspecified: Secondary | ICD-10-CM | POA: Diagnosis not present

## 2022-05-24 DIAGNOSIS — Z0000 Encounter for general adult medical examination without abnormal findings: Secondary | ICD-10-CM | POA: Diagnosis not present

## 2022-05-24 DIAGNOSIS — R21 Rash and other nonspecific skin eruption: Secondary | ICD-10-CM | POA: Diagnosis not present

## 2022-05-24 DIAGNOSIS — Z794 Long term (current) use of insulin: Secondary | ICD-10-CM | POA: Diagnosis not present

## 2022-05-24 DIAGNOSIS — Z87891 Personal history of nicotine dependence: Secondary | ICD-10-CM | POA: Diagnosis not present

## 2022-05-24 DIAGNOSIS — Z8249 Family history of ischemic heart disease and other diseases of the circulatory system: Secondary | ICD-10-CM | POA: Diagnosis not present

## 2022-05-24 DIAGNOSIS — R600 Localized edema: Secondary | ICD-10-CM | POA: Diagnosis not present

## 2022-05-24 DIAGNOSIS — Z79899 Other long term (current) drug therapy: Secondary | ICD-10-CM | POA: Diagnosis not present

## 2022-05-24 DIAGNOSIS — E785 Hyperlipidemia, unspecified: Secondary | ICD-10-CM | POA: Diagnosis not present

## 2022-05-24 DIAGNOSIS — F322 Major depressive disorder, single episode, severe without psychotic features: Secondary | ICD-10-CM | POA: Diagnosis not present

## 2022-05-24 DIAGNOSIS — M7592 Shoulder lesion, unspecified, left shoulder: Secondary | ICD-10-CM | POA: Diagnosis not present

## 2022-05-24 DIAGNOSIS — R45851 Suicidal ideations: Secondary | ICD-10-CM | POA: Diagnosis not present

## 2022-05-24 DIAGNOSIS — F419 Anxiety disorder, unspecified: Secondary | ICD-10-CM | POA: Diagnosis not present

## 2022-05-24 DIAGNOSIS — E119 Type 2 diabetes mellitus without complications: Secondary | ICD-10-CM | POA: Diagnosis not present

## 2022-05-24 DIAGNOSIS — Z20822 Contact with and (suspected) exposure to covid-19: Secondary | ICD-10-CM | POA: Diagnosis not present

## 2022-05-24 DIAGNOSIS — I10 Essential (primary) hypertension: Secondary | ICD-10-CM | POA: Diagnosis not present

## 2022-05-24 DIAGNOSIS — Z7984 Long term (current) use of oral hypoglycemic drugs: Secondary | ICD-10-CM | POA: Diagnosis not present

## 2022-05-24 DIAGNOSIS — M7591 Shoulder lesion, unspecified, right shoulder: Secondary | ICD-10-CM | POA: Diagnosis not present

## 2022-05-24 DIAGNOSIS — Z833 Family history of diabetes mellitus: Secondary | ICD-10-CM | POA: Diagnosis not present

## 2022-06-19 DIAGNOSIS — F332 Major depressive disorder, recurrent severe without psychotic features: Secondary | ICD-10-CM | POA: Diagnosis not present

## 2022-07-05 DIAGNOSIS — F332 Major depressive disorder, recurrent severe without psychotic features: Secondary | ICD-10-CM | POA: Diagnosis not present

## 2022-07-08 DIAGNOSIS — G4733 Obstructive sleep apnea (adult) (pediatric): Secondary | ICD-10-CM | POA: Diagnosis not present

## 2022-07-10 DIAGNOSIS — F331 Major depressive disorder, recurrent, moderate: Secondary | ICD-10-CM | POA: Diagnosis not present

## 2022-07-10 DIAGNOSIS — F4310 Post-traumatic stress disorder, unspecified: Secondary | ICD-10-CM | POA: Diagnosis not present

## 2022-07-10 DIAGNOSIS — F3112 Bipolar disorder, current episode manic without psychotic features, moderate: Secondary | ICD-10-CM | POA: Diagnosis not present

## 2022-07-15 DIAGNOSIS — L03113 Cellulitis of right upper limb: Secondary | ICD-10-CM | POA: Diagnosis not present

## 2022-07-15 DIAGNOSIS — Z136 Encounter for screening for cardiovascular disorders: Secondary | ICD-10-CM | POA: Diagnosis not present

## 2022-07-15 DIAGNOSIS — Z6839 Body mass index (BMI) 39.0-39.9, adult: Secondary | ICD-10-CM | POA: Diagnosis not present

## 2022-07-15 DIAGNOSIS — Z7182 Exercise counseling: Secondary | ICD-10-CM | POA: Diagnosis not present

## 2022-07-17 DIAGNOSIS — M47816 Spondylosis without myelopathy or radiculopathy, lumbar region: Secondary | ICD-10-CM | POA: Diagnosis not present

## 2022-07-17 DIAGNOSIS — M5430 Sciatica, unspecified side: Secondary | ICD-10-CM | POA: Diagnosis not present

## 2022-07-17 DIAGNOSIS — Z1389 Encounter for screening for other disorder: Secondary | ICD-10-CM | POA: Diagnosis not present

## 2022-07-17 DIAGNOSIS — M179 Osteoarthritis of knee, unspecified: Secondary | ICD-10-CM | POA: Diagnosis not present

## 2022-07-19 DIAGNOSIS — F332 Major depressive disorder, recurrent severe without psychotic features: Secondary | ICD-10-CM | POA: Diagnosis not present

## 2022-07-24 DIAGNOSIS — G4733 Obstructive sleep apnea (adult) (pediatric): Secondary | ICD-10-CM | POA: Diagnosis not present

## 2022-07-24 DIAGNOSIS — Z87891 Personal history of nicotine dependence: Secondary | ICD-10-CM | POA: Diagnosis not present

## 2022-07-24 DIAGNOSIS — R5383 Other fatigue: Secondary | ICD-10-CM | POA: Diagnosis not present

## 2022-07-24 DIAGNOSIS — R0600 Dyspnea, unspecified: Secondary | ICD-10-CM | POA: Diagnosis not present

## 2022-07-31 DIAGNOSIS — F4310 Post-traumatic stress disorder, unspecified: Secondary | ICD-10-CM | POA: Diagnosis not present

## 2022-07-31 DIAGNOSIS — R921 Mammographic calcification found on diagnostic imaging of breast: Secondary | ICD-10-CM | POA: Diagnosis not present

## 2022-07-31 DIAGNOSIS — F3112 Bipolar disorder, current episode manic without psychotic features, moderate: Secondary | ICD-10-CM | POA: Diagnosis not present

## 2022-07-31 DIAGNOSIS — F331 Major depressive disorder, recurrent, moderate: Secondary | ICD-10-CM | POA: Diagnosis not present

## 2022-07-31 DIAGNOSIS — R928 Other abnormal and inconclusive findings on diagnostic imaging of breast: Secondary | ICD-10-CM | POA: Diagnosis not present

## 2022-08-07 DIAGNOSIS — E1165 Type 2 diabetes mellitus with hyperglycemia: Secondary | ICD-10-CM | POA: Diagnosis not present

## 2022-08-07 DIAGNOSIS — E669 Obesity, unspecified: Secondary | ICD-10-CM | POA: Diagnosis not present

## 2022-08-07 DIAGNOSIS — F332 Major depressive disorder, recurrent severe without psychotic features: Secondary | ICD-10-CM | POA: Diagnosis not present

## 2022-08-07 DIAGNOSIS — Z79899 Other long term (current) drug therapy: Secondary | ICD-10-CM | POA: Diagnosis not present

## 2022-08-07 DIAGNOSIS — D3502 Benign neoplasm of left adrenal gland: Secondary | ICD-10-CM | POA: Diagnosis not present

## 2022-08-09 DIAGNOSIS — L209 Atopic dermatitis, unspecified: Secondary | ICD-10-CM | POA: Diagnosis not present

## 2022-08-09 DIAGNOSIS — L281 Prurigo nodularis: Secondary | ICD-10-CM | POA: Diagnosis not present

## 2022-08-09 DIAGNOSIS — L299 Pruritus, unspecified: Secondary | ICD-10-CM | POA: Diagnosis not present

## 2022-08-14 DIAGNOSIS — F331 Major depressive disorder, recurrent, moderate: Secondary | ICD-10-CM | POA: Diagnosis not present

## 2022-08-14 DIAGNOSIS — F4310 Post-traumatic stress disorder, unspecified: Secondary | ICD-10-CM | POA: Diagnosis not present

## 2022-08-14 DIAGNOSIS — F3112 Bipolar disorder, current episode manic without psychotic features, moderate: Secondary | ICD-10-CM | POA: Diagnosis not present

## 2022-08-21 DIAGNOSIS — Z1389 Encounter for screening for other disorder: Secondary | ICD-10-CM | POA: Diagnosis not present

## 2022-08-21 DIAGNOSIS — M47816 Spondylosis without myelopathy or radiculopathy, lumbar region: Secondary | ICD-10-CM | POA: Diagnosis not present

## 2022-08-21 DIAGNOSIS — M179 Osteoarthritis of knee, unspecified: Secondary | ICD-10-CM | POA: Diagnosis not present

## 2022-08-21 DIAGNOSIS — M5430 Sciatica, unspecified side: Secondary | ICD-10-CM | POA: Diagnosis not present

## 2022-08-21 DIAGNOSIS — F332 Major depressive disorder, recurrent severe without psychotic features: Secondary | ICD-10-CM | POA: Diagnosis not present

## 2022-08-28 DIAGNOSIS — F331 Major depressive disorder, recurrent, moderate: Secondary | ICD-10-CM | POA: Diagnosis not present

## 2022-08-28 DIAGNOSIS — F4310 Post-traumatic stress disorder, unspecified: Secondary | ICD-10-CM | POA: Diagnosis not present

## 2022-08-28 DIAGNOSIS — F3112 Bipolar disorder, current episode manic without psychotic features, moderate: Secondary | ICD-10-CM | POA: Diagnosis not present

## 2022-08-30 DIAGNOSIS — L299 Pruritus, unspecified: Secondary | ICD-10-CM | POA: Diagnosis not present

## 2022-08-30 DIAGNOSIS — L281 Prurigo nodularis: Secondary | ICD-10-CM | POA: Diagnosis not present

## 2022-09-02 DIAGNOSIS — M25561 Pain in right knee: Secondary | ICD-10-CM | POA: Diagnosis not present

## 2022-09-02 DIAGNOSIS — M5451 Vertebrogenic low back pain: Secondary | ICD-10-CM | POA: Diagnosis not present

## 2022-09-02 DIAGNOSIS — R262 Difficulty in walking, not elsewhere classified: Secondary | ICD-10-CM | POA: Diagnosis not present

## 2022-09-02 DIAGNOSIS — M6281 Muscle weakness (generalized): Secondary | ICD-10-CM | POA: Diagnosis not present

## 2022-09-03 DIAGNOSIS — M5416 Radiculopathy, lumbar region: Secondary | ICD-10-CM | POA: Diagnosis not present

## 2022-09-06 DIAGNOSIS — M25561 Pain in right knee: Secondary | ICD-10-CM | POA: Diagnosis not present

## 2022-09-06 DIAGNOSIS — F332 Major depressive disorder, recurrent severe without psychotic features: Secondary | ICD-10-CM | POA: Diagnosis not present

## 2022-09-06 DIAGNOSIS — R262 Difficulty in walking, not elsewhere classified: Secondary | ICD-10-CM | POA: Diagnosis not present

## 2022-09-06 DIAGNOSIS — M5451 Vertebrogenic low back pain: Secondary | ICD-10-CM | POA: Diagnosis not present

## 2022-09-06 DIAGNOSIS — M6281 Muscle weakness (generalized): Secondary | ICD-10-CM | POA: Diagnosis not present

## 2022-09-11 DIAGNOSIS — F331 Major depressive disorder, recurrent, moderate: Secondary | ICD-10-CM | POA: Diagnosis not present

## 2022-09-11 DIAGNOSIS — R262 Difficulty in walking, not elsewhere classified: Secondary | ICD-10-CM | POA: Diagnosis not present

## 2022-09-11 DIAGNOSIS — M5451 Vertebrogenic low back pain: Secondary | ICD-10-CM | POA: Diagnosis not present

## 2022-09-11 DIAGNOSIS — M6281 Muscle weakness (generalized): Secondary | ICD-10-CM | POA: Diagnosis not present

## 2022-09-11 DIAGNOSIS — M25561 Pain in right knee: Secondary | ICD-10-CM | POA: Diagnosis not present

## 2022-09-11 DIAGNOSIS — F4310 Post-traumatic stress disorder, unspecified: Secondary | ICD-10-CM | POA: Diagnosis not present

## 2022-09-11 DIAGNOSIS — F3112 Bipolar disorder, current episode manic without psychotic features, moderate: Secondary | ICD-10-CM | POA: Diagnosis not present

## 2022-09-13 DIAGNOSIS — R600 Localized edema: Secondary | ICD-10-CM | POA: Diagnosis not present

## 2022-09-13 DIAGNOSIS — M5451 Vertebrogenic low back pain: Secondary | ICD-10-CM | POA: Diagnosis not present

## 2022-09-13 DIAGNOSIS — R262 Difficulty in walking, not elsewhere classified: Secondary | ICD-10-CM | POA: Diagnosis not present

## 2022-09-13 DIAGNOSIS — M25561 Pain in right knee: Secondary | ICD-10-CM | POA: Diagnosis not present

## 2022-09-13 DIAGNOSIS — Z6841 Body Mass Index (BMI) 40.0 and over, adult: Secondary | ICD-10-CM | POA: Diagnosis not present

## 2022-09-13 DIAGNOSIS — I10 Essential (primary) hypertension: Secondary | ICD-10-CM | POA: Diagnosis not present

## 2022-09-13 DIAGNOSIS — Z136 Encounter for screening for cardiovascular disorders: Secondary | ICD-10-CM | POA: Diagnosis not present

## 2022-09-13 DIAGNOSIS — M6281 Muscle weakness (generalized): Secondary | ICD-10-CM | POA: Diagnosis not present

## 2022-09-18 DIAGNOSIS — R262 Difficulty in walking, not elsewhere classified: Secondary | ICD-10-CM | POA: Diagnosis not present

## 2022-09-18 DIAGNOSIS — M6281 Muscle weakness (generalized): Secondary | ICD-10-CM | POA: Diagnosis not present

## 2022-09-18 DIAGNOSIS — M179 Osteoarthritis of knee, unspecified: Secondary | ICD-10-CM | POA: Diagnosis not present

## 2022-09-18 DIAGNOSIS — Z1389 Encounter for screening for other disorder: Secondary | ICD-10-CM | POA: Diagnosis not present

## 2022-09-18 DIAGNOSIS — M25561 Pain in right knee: Secondary | ICD-10-CM | POA: Diagnosis not present

## 2022-09-18 DIAGNOSIS — M5430 Sciatica, unspecified side: Secondary | ICD-10-CM | POA: Diagnosis not present

## 2022-09-18 DIAGNOSIS — M5451 Vertebrogenic low back pain: Secondary | ICD-10-CM | POA: Diagnosis not present

## 2022-09-18 DIAGNOSIS — M47816 Spondylosis without myelopathy or radiculopathy, lumbar region: Secondary | ICD-10-CM | POA: Diagnosis not present

## 2022-09-20 DIAGNOSIS — M5451 Vertebrogenic low back pain: Secondary | ICD-10-CM | POA: Diagnosis not present

## 2022-09-20 DIAGNOSIS — M6281 Muscle weakness (generalized): Secondary | ICD-10-CM | POA: Diagnosis not present

## 2022-09-20 DIAGNOSIS — R262 Difficulty in walking, not elsewhere classified: Secondary | ICD-10-CM | POA: Diagnosis not present

## 2022-09-20 DIAGNOSIS — M25561 Pain in right knee: Secondary | ICD-10-CM | POA: Diagnosis not present

## 2022-09-24 DIAGNOSIS — G4733 Obstructive sleep apnea (adult) (pediatric): Secondary | ICD-10-CM | POA: Diagnosis not present

## 2022-09-25 DIAGNOSIS — R262 Difficulty in walking, not elsewhere classified: Secondary | ICD-10-CM | POA: Diagnosis not present

## 2022-09-25 DIAGNOSIS — M25561 Pain in right knee: Secondary | ICD-10-CM | POA: Diagnosis not present

## 2022-09-25 DIAGNOSIS — M6281 Muscle weakness (generalized): Secondary | ICD-10-CM | POA: Diagnosis not present

## 2022-09-25 DIAGNOSIS — M5451 Vertebrogenic low back pain: Secondary | ICD-10-CM | POA: Diagnosis not present

## 2022-10-01 DIAGNOSIS — M5416 Radiculopathy, lumbar region: Secondary | ICD-10-CM | POA: Diagnosis not present

## 2022-10-02 DIAGNOSIS — M5451 Vertebrogenic low back pain: Secondary | ICD-10-CM | POA: Diagnosis not present

## 2022-10-02 DIAGNOSIS — R0600 Dyspnea, unspecified: Secondary | ICD-10-CM | POA: Diagnosis not present

## 2022-10-02 DIAGNOSIS — G4733 Obstructive sleep apnea (adult) (pediatric): Secondary | ICD-10-CM | POA: Diagnosis not present

## 2022-10-02 DIAGNOSIS — Z87891 Personal history of nicotine dependence: Secondary | ICD-10-CM | POA: Diagnosis not present

## 2022-10-02 DIAGNOSIS — M6281 Muscle weakness (generalized): Secondary | ICD-10-CM | POA: Diagnosis not present

## 2022-10-02 DIAGNOSIS — M25561 Pain in right knee: Secondary | ICD-10-CM | POA: Diagnosis not present

## 2022-10-02 DIAGNOSIS — R262 Difficulty in walking, not elsewhere classified: Secondary | ICD-10-CM | POA: Diagnosis not present

## 2022-10-02 DIAGNOSIS — R5383 Other fatigue: Secondary | ICD-10-CM | POA: Diagnosis not present

## 2022-10-04 DIAGNOSIS — M25561 Pain in right knee: Secondary | ICD-10-CM | POA: Diagnosis not present

## 2022-10-04 DIAGNOSIS — R262 Difficulty in walking, not elsewhere classified: Secondary | ICD-10-CM | POA: Diagnosis not present

## 2022-10-04 DIAGNOSIS — M6281 Muscle weakness (generalized): Secondary | ICD-10-CM | POA: Diagnosis not present

## 2022-10-04 DIAGNOSIS — M5451 Vertebrogenic low back pain: Secondary | ICD-10-CM | POA: Diagnosis not present

## 2022-10-04 DIAGNOSIS — L299 Pruritus, unspecified: Secondary | ICD-10-CM | POA: Diagnosis not present

## 2022-10-09 DIAGNOSIS — F3112 Bipolar disorder, current episode manic without psychotic features, moderate: Secondary | ICD-10-CM | POA: Diagnosis not present

## 2022-10-09 DIAGNOSIS — M6281 Muscle weakness (generalized): Secondary | ICD-10-CM | POA: Diagnosis not present

## 2022-10-09 DIAGNOSIS — R262 Difficulty in walking, not elsewhere classified: Secondary | ICD-10-CM | POA: Diagnosis not present

## 2022-10-09 DIAGNOSIS — M25561 Pain in right knee: Secondary | ICD-10-CM | POA: Diagnosis not present

## 2022-10-09 DIAGNOSIS — F4310 Post-traumatic stress disorder, unspecified: Secondary | ICD-10-CM | POA: Diagnosis not present

## 2022-10-09 DIAGNOSIS — M5451 Vertebrogenic low back pain: Secondary | ICD-10-CM | POA: Diagnosis not present

## 2022-10-09 DIAGNOSIS — F331 Major depressive disorder, recurrent, moderate: Secondary | ICD-10-CM | POA: Diagnosis not present

## 2022-10-09 DIAGNOSIS — H524 Presbyopia: Secondary | ICD-10-CM | POA: Diagnosis not present

## 2022-10-14 DIAGNOSIS — E1165 Type 2 diabetes mellitus with hyperglycemia: Secondary | ICD-10-CM | POA: Diagnosis not present

## 2022-10-14 DIAGNOSIS — Z79899 Other long term (current) drug therapy: Secondary | ICD-10-CM | POA: Diagnosis not present

## 2022-10-14 DIAGNOSIS — D3502 Benign neoplasm of left adrenal gland: Secondary | ICD-10-CM | POA: Diagnosis not present

## 2022-10-14 DIAGNOSIS — E669 Obesity, unspecified: Secondary | ICD-10-CM | POA: Diagnosis not present

## 2022-10-25 DIAGNOSIS — L299 Pruritus, unspecified: Secondary | ICD-10-CM | POA: Diagnosis not present

## 2022-11-04 DIAGNOSIS — G4733 Obstructive sleep apnea (adult) (pediatric): Secondary | ICD-10-CM | POA: Diagnosis not present

## 2022-11-08 DIAGNOSIS — L299 Pruritus, unspecified: Secondary | ICD-10-CM | POA: Diagnosis not present

## 2022-11-15 DIAGNOSIS — F3173 Bipolar disorder, in partial remission, most recent episode manic: Secondary | ICD-10-CM | POA: Diagnosis not present

## 2022-11-15 DIAGNOSIS — F4310 Post-traumatic stress disorder, unspecified: Secondary | ICD-10-CM | POA: Diagnosis not present

## 2022-11-21 DIAGNOSIS — M5430 Sciatica, unspecified side: Secondary | ICD-10-CM | POA: Diagnosis not present

## 2022-11-21 DIAGNOSIS — M47816 Spondylosis without myelopathy or radiculopathy, lumbar region: Secondary | ICD-10-CM | POA: Diagnosis not present

## 2022-11-21 DIAGNOSIS — G894 Chronic pain syndrome: Secondary | ICD-10-CM | POA: Diagnosis not present

## 2022-11-21 DIAGNOSIS — M179 Osteoarthritis of knee, unspecified: Secondary | ICD-10-CM | POA: Diagnosis not present

## 2022-11-29 DIAGNOSIS — L299 Pruritus, unspecified: Secondary | ICD-10-CM | POA: Diagnosis not present

## 2022-11-29 DIAGNOSIS — L281 Prurigo nodularis: Secondary | ICD-10-CM | POA: Diagnosis not present

## 2022-11-29 DIAGNOSIS — L209 Atopic dermatitis, unspecified: Secondary | ICD-10-CM | POA: Diagnosis not present

## 2022-12-04 DIAGNOSIS — G4733 Obstructive sleep apnea (adult) (pediatric): Secondary | ICD-10-CM | POA: Diagnosis not present

## 2022-12-04 DIAGNOSIS — R0600 Dyspnea, unspecified: Secondary | ICD-10-CM | POA: Diagnosis not present

## 2022-12-04 DIAGNOSIS — R5383 Other fatigue: Secondary | ICD-10-CM | POA: Diagnosis not present

## 2022-12-04 DIAGNOSIS — Z87891 Personal history of nicotine dependence: Secondary | ICD-10-CM | POA: Diagnosis not present

## 2022-12-05 DIAGNOSIS — G4733 Obstructive sleep apnea (adult) (pediatric): Secondary | ICD-10-CM | POA: Diagnosis not present

## 2022-12-06 DIAGNOSIS — F332 Major depressive disorder, recurrent severe without psychotic features: Secondary | ICD-10-CM | POA: Diagnosis not present

## 2022-12-10 DIAGNOSIS — J209 Acute bronchitis, unspecified: Secondary | ICD-10-CM | POA: Diagnosis not present

## 2022-12-10 DIAGNOSIS — R051 Acute cough: Secondary | ICD-10-CM | POA: Diagnosis not present

## 2022-12-10 DIAGNOSIS — J0100 Acute maxillary sinusitis, unspecified: Secondary | ICD-10-CM | POA: Diagnosis not present

## 2022-12-13 DIAGNOSIS — F4310 Post-traumatic stress disorder, unspecified: Secondary | ICD-10-CM | POA: Diagnosis not present

## 2022-12-13 DIAGNOSIS — F3173 Bipolar disorder, in partial remission, most recent episode manic: Secondary | ICD-10-CM | POA: Diagnosis not present

## 2022-12-22 DIAGNOSIS — R519 Headache, unspecified: Secondary | ICD-10-CM | POA: Diagnosis not present

## 2022-12-22 DIAGNOSIS — J0100 Acute maxillary sinusitis, unspecified: Secondary | ICD-10-CM | POA: Diagnosis not present

## 2022-12-22 DIAGNOSIS — J209 Acute bronchitis, unspecified: Secondary | ICD-10-CM | POA: Diagnosis not present

## 2022-12-22 DIAGNOSIS — M791 Myalgia, unspecified site: Secondary | ICD-10-CM | POA: Diagnosis not present

## 2023-01-01 DIAGNOSIS — H40003 Preglaucoma, unspecified, bilateral: Secondary | ICD-10-CM | POA: Diagnosis not present

## 2023-01-03 DIAGNOSIS — L299 Pruritus, unspecified: Secondary | ICD-10-CM | POA: Diagnosis not present

## 2023-01-03 DIAGNOSIS — L209 Atopic dermatitis, unspecified: Secondary | ICD-10-CM | POA: Diagnosis not present

## 2023-01-03 DIAGNOSIS — G4733 Obstructive sleep apnea (adult) (pediatric): Secondary | ICD-10-CM | POA: Diagnosis not present

## 2023-01-03 DIAGNOSIS — L281 Prurigo nodularis: Secondary | ICD-10-CM | POA: Diagnosis not present

## 2023-01-10 DIAGNOSIS — F4310 Post-traumatic stress disorder, unspecified: Secondary | ICD-10-CM | POA: Diagnosis not present

## 2023-01-10 DIAGNOSIS — F3173 Bipolar disorder, in partial remission, most recent episode manic: Secondary | ICD-10-CM | POA: Diagnosis not present

## 2023-01-15 DIAGNOSIS — Z9641 Presence of insulin pump (external) (internal): Secondary | ICD-10-CM | POA: Diagnosis not present

## 2023-01-15 DIAGNOSIS — B379 Candidiasis, unspecified: Secondary | ICD-10-CM | POA: Diagnosis not present

## 2023-01-15 DIAGNOSIS — E1165 Type 2 diabetes mellitus with hyperglycemia: Secondary | ICD-10-CM | POA: Diagnosis not present

## 2023-01-15 DIAGNOSIS — R110 Nausea: Secondary | ICD-10-CM | POA: Diagnosis not present

## 2023-01-15 DIAGNOSIS — E669 Obesity, unspecified: Secondary | ICD-10-CM | POA: Diagnosis not present

## 2023-01-16 DIAGNOSIS — F332 Major depressive disorder, recurrent severe without psychotic features: Secondary | ICD-10-CM | POA: Diagnosis not present

## 2023-02-06 DIAGNOSIS — F332 Major depressive disorder, recurrent severe without psychotic features: Secondary | ICD-10-CM | POA: Diagnosis not present

## 2023-02-07 DIAGNOSIS — F3173 Bipolar disorder, in partial remission, most recent episode manic: Secondary | ICD-10-CM | POA: Diagnosis not present

## 2023-02-07 DIAGNOSIS — F4310 Post-traumatic stress disorder, unspecified: Secondary | ICD-10-CM | POA: Diagnosis not present

## 2023-02-28 DIAGNOSIS — F332 Major depressive disorder, recurrent severe without psychotic features: Secondary | ICD-10-CM | POA: Diagnosis not present

## 2023-03-11 DIAGNOSIS — F332 Major depressive disorder, recurrent severe without psychotic features: Secondary | ICD-10-CM | POA: Diagnosis not present

## 2023-03-14 DIAGNOSIS — F3173 Bipolar disorder, in partial remission, most recent episode manic: Secondary | ICD-10-CM | POA: Diagnosis not present

## 2023-03-14 DIAGNOSIS — F4310 Post-traumatic stress disorder, unspecified: Secondary | ICD-10-CM | POA: Diagnosis not present

## 2023-03-27 DIAGNOSIS — F4310 Post-traumatic stress disorder, unspecified: Secondary | ICD-10-CM | POA: Diagnosis not present

## 2023-03-27 DIAGNOSIS — F3173 Bipolar disorder, in partial remission, most recent episode manic: Secondary | ICD-10-CM | POA: Diagnosis not present

## 2023-04-03 DIAGNOSIS — F332 Major depressive disorder, recurrent severe without psychotic features: Secondary | ICD-10-CM | POA: Diagnosis not present

## 2023-04-10 DIAGNOSIS — F3173 Bipolar disorder, in partial remission, most recent episode manic: Secondary | ICD-10-CM | POA: Diagnosis not present

## 2023-04-10 DIAGNOSIS — F4310 Post-traumatic stress disorder, unspecified: Secondary | ICD-10-CM | POA: Diagnosis not present

## 2023-04-21 DIAGNOSIS — E1165 Type 2 diabetes mellitus with hyperglycemia: Secondary | ICD-10-CM | POA: Diagnosis not present

## 2023-04-21 DIAGNOSIS — D3502 Benign neoplasm of left adrenal gland: Secondary | ICD-10-CM | POA: Diagnosis not present

## 2023-04-21 DIAGNOSIS — Z9641 Presence of insulin pump (external) (internal): Secondary | ICD-10-CM | POA: Diagnosis not present

## 2023-04-25 DIAGNOSIS — R55 Syncope and collapse: Secondary | ICD-10-CM | POA: Diagnosis not present

## 2023-04-25 DIAGNOSIS — E1165 Type 2 diabetes mellitus with hyperglycemia: Secondary | ICD-10-CM | POA: Diagnosis not present

## 2023-04-25 DIAGNOSIS — I10 Essential (primary) hypertension: Secondary | ICD-10-CM | POA: Diagnosis not present

## 2023-04-25 DIAGNOSIS — F339 Major depressive disorder, recurrent, unspecified: Secondary | ICD-10-CM | POA: Diagnosis not present

## 2023-04-25 DIAGNOSIS — Z6838 Body mass index (BMI) 38.0-38.9, adult: Secondary | ICD-10-CM | POA: Diagnosis not present

## 2023-04-29 DIAGNOSIS — E785 Hyperlipidemia, unspecified: Secondary | ICD-10-CM | POA: Diagnosis not present

## 2023-04-29 DIAGNOSIS — Z23 Encounter for immunization: Secondary | ICD-10-CM | POA: Diagnosis not present

## 2023-04-29 DIAGNOSIS — I10 Essential (primary) hypertension: Secondary | ICD-10-CM | POA: Diagnosis not present

## 2023-04-29 DIAGNOSIS — E1165 Type 2 diabetes mellitus with hyperglycemia: Secondary | ICD-10-CM | POA: Diagnosis not present

## 2023-05-08 DIAGNOSIS — F4310 Post-traumatic stress disorder, unspecified: Secondary | ICD-10-CM | POA: Diagnosis not present

## 2023-05-08 DIAGNOSIS — F3173 Bipolar disorder, in partial remission, most recent episode manic: Secondary | ICD-10-CM | POA: Diagnosis not present

## 2023-06-05 DIAGNOSIS — F4310 Post-traumatic stress disorder, unspecified: Secondary | ICD-10-CM | POA: Diagnosis not present

## 2023-06-05 DIAGNOSIS — F3173 Bipolar disorder, in partial remission, most recent episode manic: Secondary | ICD-10-CM | POA: Diagnosis not present

## 2023-06-14 DIAGNOSIS — F319 Bipolar disorder, unspecified: Secondary | ICD-10-CM | POA: Diagnosis not present

## 2023-06-16 DIAGNOSIS — F332 Major depressive disorder, recurrent severe without psychotic features: Secondary | ICD-10-CM | POA: Diagnosis not present

## 2023-06-19 DIAGNOSIS — L281 Prurigo nodularis: Secondary | ICD-10-CM | POA: Diagnosis not present

## 2023-06-19 DIAGNOSIS — L209 Atopic dermatitis, unspecified: Secondary | ICD-10-CM | POA: Diagnosis not present

## 2023-06-19 DIAGNOSIS — L299 Pruritus, unspecified: Secondary | ICD-10-CM | POA: Diagnosis not present

## 2023-07-07 DIAGNOSIS — F332 Major depressive disorder, recurrent severe without psychotic features: Secondary | ICD-10-CM | POA: Diagnosis not present

## 2023-07-11 ENCOUNTER — Encounter: Payer: Self-pay | Admitting: Internal Medicine

## 2023-07-11 ENCOUNTER — Ambulatory Visit: Payer: Medicare Other | Attending: Internal Medicine | Admitting: Internal Medicine

## 2023-07-11 ENCOUNTER — Ambulatory Visit (INDEPENDENT_AMBULATORY_CARE_PROVIDER_SITE_OTHER): Payer: Medicare Other

## 2023-07-11 VITALS — BP 124/70 | HR 79 | Ht 62.0 in | Wt 201.8 lb

## 2023-07-11 DIAGNOSIS — R55 Syncope and collapse: Secondary | ICD-10-CM

## 2023-07-11 DIAGNOSIS — Z136 Encounter for screening for cardiovascular disorders: Secondary | ICD-10-CM

## 2023-07-11 NOTE — Progress Notes (Signed)
Cardiology Office Note:  .   Date:  07/11/2023  ID:  Michelle Rasmussen, DOB 06/26/1966, MRN [id number] PCP: [place], Sarah T, MD  [facility] Providers Cardiologist:  None    History of Present Illness: .   Michelle Rasmussen is a 57 y.o. female DM2   History of Present Illness   She presents with recurrent fainting spells that have been occurring for years and have recently worsened. The episodes are characterized by dizziness, a tingling sensation in the tongue, and jerking movements of the hands. The frequency of these episodes varies, with about five occurrences in the past month and more in the month prior. There are periods where the patient may go a month without an episode. The episodes are brief, lasting only a few seconds, and resolve spontaneously. The patient reports that these episodes often occur when she is getting out of a car or raising her arms above her head. Past workups, including a heart monitor and stress test in 2013, have been normal. The patient is unsure if she has had imaging of her brain.       Father had CAD in his 17s  ROS:  per HPI otherwise negative   Studies Reviewed: Marland Kitchen        EKG Interpretation Date/Time:  Friday July 11 2023 13:35:16 EDT Ventricular Rate:  79 PR Interval:  134 QRS Duration:  86 QT Interval:  372 QTC Calculation: 426 R Axis:   17  Text Interpretation: Normal sinus rhythm Normal ECG No previous ECGs available Confirmed by Carolan Clines ([id number]) on 07/11/2023 1:50:42 PM    Risk Assessment/Calculations:    Physical Exam:   VS:  Vitals:   07/11/23 1340  BP: 124/70  Pulse: 79  SpO2: 95%    Wt Readings from Last 3 Encounters:  02/07/14 178 lb (80.7 kg)  09/02/13 172 lb (78 kg)  05/26/12 186 lb (84.4 kg)    GEN: Well nourished, well developed in no acute distress NECK: No JVD; No carotid bruits CARDIAC: RRR, no murmurs, rubs, gallops RESPIRATORY:  Clear to auscultation without rales, wheezing or rhonchi  ABDOMEN: Soft,  non-tender, non-distended EXTREMITIES:  No edema; No deformity   ASSESSMENT AND PLAN: .   Assessment and Plan    Syncope Recurrent episodes of dizziness and near syncope with associated tingling and jerking movements. Episodes have been occurring for years but have increased in frequency recently. Prior cardiac workup including stress test and heart monitor in 2013 were normal. Neurological cause such as seizures can be considered but not confirmed.  -Order a heart monitor to be worn for one week to evaluate for any cardiac arrhythmias during symptomatic episodes. The monitor will be mailed to the patient's home with instructions for use and return. -Consider brain imaging -Results will be communicated to the patient via the computer system. If abnormal, further discussion and steps will be taken.          Dispo:  1 week ziopatch Follow up PRN  Signed, Maisie Fus, MD

## 2023-07-11 NOTE — Progress Notes (Unsigned)
Enrolled patient for a 7 day Zio XT monitor to be mailed to patients home.  

## 2023-07-11 NOTE — Patient Instructions (Signed)
Medication Instructions:  Your physician recommends that you continue on your current medications as directed. Please refer to the Current Medication list given to you today.  *If you need a refill on your cardiac medications before your next appointment, please call your pharmacy*  Lab Work: If you have labs (blood work) drawn today and your tests are completely normal, you will receive your results only by: MyChart Message (if you have MyChart) OR A paper copy in the mail If you have any lab test that is abnormal or we need to change your treatment, we will call you to review the results.  Testing/Procedures: Your physician has recommended that you wear a zio monitor. The monitors are medical devices that record the heart's electrical activity. Doctors most often [place] these monitors to diagnose arrhythmias. Arrhythmias are problems with the speed or rhythm of the heartbeat. The monitor is a small, portable device. You can wear one while you do your normal daily activities. This is usually used to diagnose what is causing palpitations/syncope (passing out).  Follow-Up: At [facility], you and your health needs are our priority.  As part of our continuing mission to provide you with exceptional heart care, we have created designated Provider Care Teams.  These Care Teams include your primary Cardiologist (physician) and Advanced Practice Providers (APPs -  Physician Assistants and Nurse Practitioners) who all work together to provide you with the care you need, when you need it.  We recommend signing up for the patient portal called "MyChart".  Sign up information is provided on this After Visit Summary.  MyChart is used to connect with patients for Virtual Visits (Telemedicine).  Patients are able to view lab/test results, encounter notes, upcoming appointments, etc.  Non-urgent messages can be sent to your provider as well.   To learn more about what you can do with MyChart, go to  [email or web address].    Your next appointment:   As needed  Provider:   Maisie Fus, MD

## 2023-07-14 DIAGNOSIS — F4310 Post-traumatic stress disorder, unspecified: Secondary | ICD-10-CM | POA: Diagnosis not present

## 2023-07-14 DIAGNOSIS — F3173 Bipolar disorder, in partial remission, most recent episode manic: Secondary | ICD-10-CM | POA: Diagnosis not present

## 2023-07-15 DIAGNOSIS — Z136 Encounter for screening for cardiovascular disorders: Secondary | ICD-10-CM

## 2023-07-15 DIAGNOSIS — R55 Syncope and collapse: Secondary | ICD-10-CM | POA: Diagnosis not present

## 2023-07-30 DIAGNOSIS — R55 Syncope and collapse: Secondary | ICD-10-CM | POA: Diagnosis not present

## 2023-07-30 DIAGNOSIS — Z136 Encounter for screening for cardiovascular disorders: Secondary | ICD-10-CM | POA: Diagnosis not present

## 2023-08-04 DIAGNOSIS — F332 Major depressive disorder, recurrent severe without psychotic features: Secondary | ICD-10-CM | POA: Diagnosis not present

## 2023-08-05 DIAGNOSIS — Z23 Encounter for immunization: Secondary | ICD-10-CM | POA: Diagnosis not present

## 2023-08-05 DIAGNOSIS — E1165 Type 2 diabetes mellitus with hyperglycemia: Secondary | ICD-10-CM | POA: Diagnosis not present

## 2023-08-05 DIAGNOSIS — F3341 Major depressive disorder, recurrent, in partial remission: Secondary | ICD-10-CM | POA: Diagnosis not present

## 2023-08-05 DIAGNOSIS — Z1231 Encounter for screening mammogram for malignant neoplasm of breast: Secondary | ICD-10-CM | POA: Diagnosis not present

## 2023-08-05 DIAGNOSIS — Z6836 Body mass index (BMI) 36.0-36.9, adult: Secondary | ICD-10-CM | POA: Diagnosis not present

## 2023-08-11 DIAGNOSIS — F4310 Post-traumatic stress disorder, unspecified: Secondary | ICD-10-CM | POA: Diagnosis not present

## 2023-08-11 DIAGNOSIS — F3173 Bipolar disorder, in partial remission, most recent episode manic: Secondary | ICD-10-CM | POA: Diagnosis not present

## 2023-09-08 DIAGNOSIS — F4310 Post-traumatic stress disorder, unspecified: Secondary | ICD-10-CM | POA: Diagnosis not present

## 2023-09-08 DIAGNOSIS — F3173 Bipolar disorder, in partial remission, most recent episode manic: Secondary | ICD-10-CM | POA: Diagnosis not present

## 2023-09-30 ENCOUNTER — Telehealth: Payer: Self-pay

## 2023-09-30 NOTE — Telephone Encounter (Signed)
Called patient from voicemail. Let her know we need a referral, labs and office notes in order to be seen

## 2023-10-06 DIAGNOSIS — F4310 Post-traumatic stress disorder, unspecified: Secondary | ICD-10-CM | POA: Diagnosis not present

## 2023-10-06 DIAGNOSIS — F3173 Bipolar disorder, in partial remission, most recent episode manic: Secondary | ICD-10-CM | POA: Diagnosis not present

## 2023-10-20 DIAGNOSIS — Z1231 Encounter for screening mammogram for malignant neoplasm of breast: Secondary | ICD-10-CM | POA: Diagnosis not present

## 2023-11-03 DIAGNOSIS — F4310 Post-traumatic stress disorder, unspecified: Secondary | ICD-10-CM | POA: Diagnosis not present

## 2023-11-03 DIAGNOSIS — F3173 Bipolar disorder, in partial remission, most recent episode manic: Secondary | ICD-10-CM | POA: Diagnosis not present

## 2023-11-12 ENCOUNTER — Other Ambulatory Visit: Payer: Self-pay

## 2023-11-12 DIAGNOSIS — E1165 Type 2 diabetes mellitus with hyperglycemia: Secondary | ICD-10-CM

## 2023-11-20 ENCOUNTER — Other Ambulatory Visit: Payer: Medicare Other

## 2023-11-20 DIAGNOSIS — E1165 Type 2 diabetes mellitus with hyperglycemia: Secondary | ICD-10-CM | POA: Diagnosis not present

## 2023-11-21 LAB — LIPID PANEL
Cholesterol: 128 mg/dL (ref <200)
HDL: 52 mg/dL (ref >=50)
LDL Cholesterol (Calc): 44 mg/dL
Non-HDL Cholesterol (Calc): 76 mg/dL (ref <130)
Total CHOL/HDL Ratio: 2.5 (calc) (ref <5.0)
Triglycerides: 275 mg/dL — ABNORMAL HIGH (ref <150)

## 2023-11-21 LAB — COMPREHENSIVE METABOLIC PANEL
AG Ratio: 1.5 (calc) (ref 1.0–2.5)
ALT: 10 U/L (ref 6–29)
AST: 12 U/L (ref 10–35)
Albumin: 4.3 g/dL (ref 3.6–5.1)
Alkaline phosphatase (APISO): 100 U/L (ref 37–153)
BUN: 17 mg/dL (ref 7–25)
CO2: 29 mmol/L (ref 20–32)
Calcium: 10.1 mg/dL (ref 8.6–10.4)
Chloride: 101 mmol/L (ref 98–110)
Creat: 0.63 mg/dL (ref 0.50–1.03)
Globulin: 2.8 g/dL (ref 1.9–3.7)
Glucose, Bld: 109 mg/dL — ABNORMAL HIGH (ref 65–99)
Potassium: 3.7 mmol/L (ref 3.5–5.3)
Sodium: 140 mmol/L (ref 135–146)
Total Bilirubin: 0.3 mg/dL (ref 0.2–1.2)
Total Protein: 7.1 g/dL (ref 6.1–8.1)

## 2023-11-21 LAB — MICROALBUMIN / CREATININE URINE RATIO
Creatinine, Urine: 120 mg/dL (ref 20–275)
Microalb Creat Ratio: 9 mg/g{creat} (ref <30)
Microalb, Ur: 1.1 mg/dL

## 2023-11-21 LAB — HEMOGLOBIN A1C
Hgb A1c MFr Bld: 9.9 %{Hb} — ABNORMAL HIGH (ref <5.7)
Mean Plasma Glucose: 237 mg/dL
eAG (mmol/L): 13.2 mmol/L

## 2023-11-24 ENCOUNTER — Encounter: Payer: Self-pay | Admitting: "Endocrinology

## 2023-11-24 ENCOUNTER — Ambulatory Visit (INDEPENDENT_AMBULATORY_CARE_PROVIDER_SITE_OTHER): Payer: Medicare Other | Admitting: "Endocrinology

## 2023-11-24 ENCOUNTER — Encounter: Payer: Medicare Other | Attending: "Endocrinology | Admitting: Nutrition

## 2023-11-24 ENCOUNTER — Other Ambulatory Visit: Payer: Self-pay

## 2023-11-24 VITALS — BP 132/80 | HR 70 | Resp 20 | Ht 62.0 in | Wt 196.2 lb

## 2023-11-24 DIAGNOSIS — Z794 Long term (current) use of insulin: Secondary | ICD-10-CM | POA: Diagnosis not present

## 2023-11-24 DIAGNOSIS — Z7984 Long term (current) use of oral hypoglycemic drugs: Secondary | ICD-10-CM | POA: Diagnosis not present

## 2023-11-24 DIAGNOSIS — E1165 Type 2 diabetes mellitus with hyperglycemia: Secondary | ICD-10-CM

## 2023-11-24 DIAGNOSIS — E782 Mixed hyperlipidemia: Secondary | ICD-10-CM | POA: Diagnosis not present

## 2023-11-24 DIAGNOSIS — Z9641 Presence of insulin pump (external) (internal): Secondary | ICD-10-CM

## 2023-11-24 MED ORDER — OMNIPOD DASH PODS (GEN 4) MISC: 1.0000 | 5 refills | Status: DC

## 2023-11-24 MED ORDER — LANTUS SOLOSTAR 100 UNIT/ML ~~LOC~~ SOPN: 24.0000 [IU] | PEN_INJECTOR | Freq: Every day | SUBCUTANEOUS | 99 refills | Status: DC

## 2023-11-24 MED ORDER — METFORMIN HCL 1000 MG PO TABS: 1000.0000 mg | ORAL_TABLET | Freq: Two times a day (BID) | ORAL | 3 refills | Status: DC

## 2023-11-24 MED ORDER — BAQSIMI ONE PACK 3 MG/DOSE NA POWD: 1.0000 | NASAL | 3 refills | Status: DC | PRN

## 2023-11-24 MED ORDER — HUMALOG 100 UNIT/ML IJ SOLN: INTRAMUSCULAR | 5 refills | Status: DC

## 2023-11-24 MED ORDER — PIOGLITAZONE HCL 15 MG PO TABS: 15.0000 mg | ORAL_TABLET | Freq: Every day | ORAL | 3 refills | Status: DC

## 2023-11-24 NOTE — Progress Notes (Signed)
Patient is a new patient to this practice.  She says she has been out of insulin and pods for several days now.  She is currently using a Dash system.  Basal rate: 1.0u/hr, I/C7 (but patient just putting in 20u at total on bolus screen. Will adjust higher per sliding scale  ??)  ISF: 34, target 120 with correction over 130, timing 4 hours, max bolus 20, which patient only takes.   Settings were changed to: I/C: 1-putting in 15u at breakfast (bagel with cream cheese), 5 at lunch (1/2 sandwich),and 20u at supper.  Written instructions for the steps needed to give a bous and to put in blood sugar readings, and to bolus before all meals and snacks.  Says forgets to bolus-especially ac L.  STressed need to do this.  Also discussed if blood sugars drop after supper, or any meal, to reduce bolus amount by 2 units. She reported good understanding of this.  Taking care of dying mother.  CGM:  Dexcom  G7 going to her phone  Does not want a back up meter.  Says will not use this.   Low blood sugars-occasionally pcS--treats with grape juice, and continues this until CGM starts to rise.  Explained protocol for low blood sugars and need to wait 30 minutes after treating before retreating, since she will not use a meter.   Pt. Very stressed that she is out of insulin and pods and needs scripts for more supplies and insulin.  Dr. Roosevelt Locks changed pod script to 1 every other day and insulin:  84u every day.  Also stressed that [place] [facility] [place].  They were called and patient is needing script sent in for renewal for 1 year to [phone].  Says she is on 10 mg./day.  Note to Dr. Roosevelt Locks to fax this in.   Discussed also with patient need to get blood sugars/HgA1C down.  Says is needing knee surgery so this a good motivator for her.  Pt. Had no time to discuss diet-driver needing to take her home.  Reviewed with patient that she can pick up insulin and pods at [facility] today.  She agreed to do this and had  no final questions.

## 2023-11-24 NOTE — Progress Notes (Signed)
Outpatient Endocrinology Note [place], MD  11/24/23   Michelle Rasmussen May 04, 1966 [id number]  Referring Provider: Erenest Rasher, FNP Primary Care Provider: [place], Sarah T, MD Reason for consultation: Subjective   Assessment & Plan  Diagnoses and all orders for this visit:  Uncontrolled type 2 diabetes mellitus with hyperglycemia (HCC) -     Ambulatory referral to diabetic education  Insulin pump in place  Long term (current) use of oral hypoglycemic drugs  Mixed hypercholesterolemia and hypertriglyceridemia  Other orders -     metFORMIN (GLUCOPHAGE) 1000 MG tablet; Take 1 tablet (1,000 mg total) by mouth 2 (two) times daily. -     HUMALOG 100 UNIT/ML injection; Inject upto 120 units a day by pump -     pioglitazone (ACTOS) 15 MG tablet; Take 1 tablet (15 mg total) by mouth daily. -     Insulin Disposable Pump (OMNIPOD DASH PODS, GEN 4,) MISC; 1 Device by Does not apply route continuous. Change pod every 2 days -     Glucagon (BAQSIMI ONE PACK) 3 MG/DOSE POWD; Place 1 Device into the nose as needed (Low blood sugar with impaired consciousness). -     insulin glargine (LANTUS SOLOSTAR) 100 UNIT/ML Solostar Pen; Inject 24 Units into the skin daily. In case of pump failure    Diabetes Type II complicated by neuropathy, No results found for: "GFR" Hba1c goal less than 7, current Hba1c is  Lab Results  Component Value Date   HGBA1C 9.9 (H) 11/20/2023   Will recommend the following: Refilled all prescriptions  Metformin 1000mg  twice  Pioglitazone 15 mg every day  Lantus for back up in case of pump failure  Omnipod dash-with U100 humalog (needs changing pods every 2 days)  Basal rate: 1 u/hr IC 1:7 ISF 1:34 >130 Target 120 Correction above 130  No known contraindications/side effects to any of above medications Glucagon discussed and prescribed with refills on 11/24/23    -Last LD and Tg are as follows: Lab Results  Component Value Date   LDLCALC 44  11/20/2023    Lab Results  Component Value Date   TRIG 275 (H) 11/20/2023   -On atorvastatin 20 mg QD -Follow low fat diet and exercise   -Blood pressure goal <140/90 - Microalbumin/creatinine goal is < 30 -Last MA/Cr is as follows: Lab Results  Component Value Date   MICROALBUR 1.1 11/20/2023   -on ACE/ARB Losartan 100 mg every day  -diet changes including salt restriction -limit eating outside -counseled BP targets per standards of diabetes care -uncontrolled blood pressure can lead to retinopathy, nephropathy and cardiovascular and atherosclerotic heart disease  Reviewed and counseled on: -A1C target -Blood sugar targets -Complications of uncontrolled diabetes  -Checking blood sugar before meals and bedtime and bring log next visit -All medications with mechanism of action and side effects -Hypoglycemia management: rule of 15's, Glucagon Emergency Kit and medical alert ID -low-carb low-fat plate-method diet -At least 20 minutes of physical activity per day -Annual dilated retinal eye exam and foot exam -compliance and follow up needs -follow up as scheduled or earlier if problem gets worse  Call if blood sugar is less than 70 or consistently above 250    Take a 15 gm snack of carbohydrate at bedtime before you go to sleep if your blood sugar is less than 100.    If you are going to fast after midnight for a test or procedure, ask your physician for instructions on how to reduce/decrease your insulin dose.  Call if blood sugar is less than 70 or consistently above 250  -Treating a low sugar by rule of 15  (15 gms of sugar every 15 min until sugar is more than 70) If you feel your sugar is low, test your sugar to be sure If your sugar is low (less than 70), then take 15 grams of a fast acting Carbohydrate (3-4 glucose tablets or glucose gel or 4 ounces of juice or regular soda) Recheck your sugar 15 min after treating low to make sure it is more than 70 If sugar is  still less than 70, treat again with 15 grams of carbohydrate          Don't drive the hour of hypoglycemia  If unconscious/unable to eat or drink by mouth, use glucagon injection or nasal spray baqsimi and call 911. Can repeat again in 15 min if still unconscious.  Return in about 2 weeks (around 12/08/2023).   I have reviewed current medications, nurse's notes, allergies, vital signs, past medical and surgical history, family medical history, and social history for this encounter. Counseled patient on symptoms, examination findings, lab findings, imaging results, treatment decisions and monitoring and prognosis. The patient understood the recommendations and agrees with the treatment plan. All questions regarding treatment plan were fully answered.  Michelle Ocean, MD  11/24/23    History of Present Illness Michelle Rasmussen is a 58 y.o. year old female who presents for evaluation of Type II diabetes mellitus.  Sebastiana Wuest was first diagnosed around 2005. Diabetes education +  Home diabetes regimen: Metformin 1000mg  twice  Pioglitazone 15 mg every day  Humalog U100 with Omnipod   Ran out of farxiga a month ago-needs patient's assistance   COMPLICATIONS -  MI/Stroke -  retinopathy +  neuropathy -  nephropathy  SYMPTOMS REVIEWED + Polyuria, on HCTZ - Weight loss + Blurred vision  BLOOD SUGAR DATA  CGM interpretation: At today's visit, we reviewed her CGM downloads. The full report is scanned in the media. Reviewing the CGM trends, BG are mostly elevated with some lows overnight.   Physical Exam  BP 132/80 (BP Location: Left Arm, Patient Position: Sitting, Cuff Size: Large)   Pulse 70   Resp 20   Ht 5\' 2"  (1.575 m)   Wt 196 lb 3.2 oz (89 kg)   SpO2 95%   BMI 35.89 kg/m    Constitutional: well developed, well nourished Head: normocephalic, atraumatic Eyes: sclera anicteric, no redness Neck: supple Lungs: normal respiratory effort Neurology: alert and oriented Skin:  dry, no appreciable rashes Musculoskeletal: no appreciable defects Psychiatric: normal mood and affect Diabetic Foot Exam - Simple   No data filed      Current Medications Patient's Medications  New Prescriptions   GLUCAGON (BAQSIMI ONE PACK) 3 MG/DOSE POWD    Place 1 Device into the nose as needed (Low blood sugar with impaired consciousness).   INSULIN DISPOSABLE PUMP (OMNIPOD DASH PODS, GEN 4,) MISC    1 Device by Does not apply route continuous. Change pod every 2 days   INSULIN GLARGINE (LANTUS SOLOSTAR) 100 UNIT/ML SOLOSTAR PEN    Inject 24 Units into the skin daily. In case of pump failure  Previous Medications   ACETAMINOPHEN (TYLENOL) 650 MG CR TABLET    Take 650 mg by mouth as needed.   ATORVASTATIN (LIPITOR) 20 MG TABLET       CELECOXIB (CELEBREX) 100 MG CAPSULE    Take 100 mg by mouth 2 (two) times daily.  CHLORHEXIDINE (PERIDEX) 0.12 % SOLUTION       CITALOPRAM (CELEXA) 40 MG TABLET    TAKE ONE TABLET BY MOUTH ONCE DAILY   CLOBETASOL (TEMOVATE) 0.05 % EXTERNAL SOLUTION    Apply 1 Application topically as needed.   CONTINUOUS BLOOD GLUC SENSOR (FREESTYLE LIBRE 2 SENSOR) MISC    See admin instructions.   CONTINUOUS GLUCOSE RECEIVER (DEXCOM G7 RECEIVER) DEVI    1 each by Miscellaneous route in the morning.   DAPAGLIFLOZIN PROPANEDIOL (FARXIGA) 10 MG TABS    Take by mouth.   DICLOFENAC SODIUM (VOLTAREN) 1 % GEL    SMARTSIG:2-4 Gram(s) Topical 4 Times Daily PRN   DULOXETINE (CYMBALTA) 30 MG CAPSULE    Take by mouth.   DUPILUMAB [place]    Inject into the skin. EVERY 2 WEEKS   FAMOTIDINE (PEPCID) 40 MG TABLET    Take 1 tablet (40 mg total) by mouth daily for 14 days.   FLUCONAZOLE (DIFLUCAN) 150 MG TABLET    Take 1 tablet (150 mg total) by mouth once a week.   FLUOXETINE (PROZAC) 20 MG CAPSULE    Take 60 mg by mouth every morning.   FLUOXETINE (PROZAC) 40 MG CAPSULE       GABAPENTIN (NEURONTIN) 600 MG TABLET    Take 1,200 mg by mouth 3 (three) times daily.    HYDROCODONE-ACETAMINOPHEN (NORCO/VICODIN) 5-325 MG TABLET    Take 1 tablet by mouth every 4 (four) hours as needed.   HYDROXYZINE (ATARAX) 25 MG TABLET    Take 25 mg by mouth 2 (two) times daily.   HYDROXYZINE (ATARAX) 50 MG TABLET    Take 50 mg by mouth at bedtime.   IBUPROFEN (ADVIL) 200 MG TABLET    Take by mouth as needed.   LITHIUM CARBONATE (LITHOBID) 300 MG ER TABLET    Take 300 mg by mouth at bedtime.   LOSARTAN-HYDROCHLOROTHIAZIDE (HYZAAR) 50-12.5 MG TABLET       MELOXICAM (MOBIC) 15 MG TABLET       MUPIROCIN OINTMENT (BACTROBAN) 2 %       NEOMYCIN-POLYMYXIN-HYDROCORTISONE (CORTISPORIN) 1 % SOLN OTIC SOLUTION    Apply 1-2 drops to toes once daily at bedtime   ONDANSETRON (ZOFRAN) 8 MG TABLET    Take 8 mg by mouth every 8 (eight) hours as needed.   OXYBUTYNIN (DITROPAN-XL) 10 MG 24 HR TABLET    Take 10 mg by mouth daily.   PANTOPRAZOLE (PROTONIX) 40 MG TABLET    SMARTSIG:1 Tablet(s) By Mouth Morning-Evening   PHENTERMINE (ADIPEX-P) 37.5 MG TABLET       PREGABALIN (LYRICA) 100 MG CAPSULE    Take 100 mg by mouth 2 (two) times daily.   PROMETHAZINE-DEXTROMETHORPHAN (PROMETHAZINE-DM) 6.25-15 MG/5ML SYRUP    Take 5 mLs by mouth 4 (four) times daily as needed for cough.   SOLIFENACIN (VESICARE) 10 MG TABLET    Take 5 mg by mouth daily.   SULFAMETHOXAZOLE-TRIMETHOPRIM (BACTRIM) 400-80 MG TABLET    Take 1 tablet by mouth 2 (two) times daily.   TRAZODONE (DESYREL) 50 MG TABLET    Take by mouth.   TRIAMCINOLONE OINTMENT (KENALOG) 0.1 %    Apply topically.  Modified Medications   Modified Medication Previous Medication   HUMALOG 100 UNIT/ML INJECTION HUMALOG 100 UNIT/ML injection      Inject upto 120 units a day by pump    Inject into the skin.   METFORMIN (GLUCOPHAGE) 1000 MG TABLET metFORMIN (GLUCOPHAGE) 1000 MG tablet      Take 1  tablet (1,000 mg total) by mouth 2 (two) times daily.    Take 1,000 mg by mouth 2 (two) times daily.   PIOGLITAZONE (ACTOS) 15 MG TABLET pioglitazone (ACTOS) 15 MG  tablet      Take 1 tablet (15 mg total) by mouth daily.    Take 15 mg by mouth daily.  Discontinued Medications   CANAGLIFLOZIN (INVOKANA) 300 MG TABS    1 po QD   GLIPIZIDE (GLUCOTROL) 10 MG TABLET    TAKE ONE TABLET BY MOUTH TWICE DAILY BEFORE A MEAL   INSULIN DETEMIR (LEVEMIR) 100 UNIT/ML INJECTION    Inject 20 Units into the skin at bedtime.   INSULIN DISPOSABLE PUMP (V-GO 30) KIT    See admin instructions.   SAXAGLIPTIN-METFORMIN (KOMBIGLYZE XR) 2.02-999 MG TB24    Take 1 tablet by mouth 2 (two) times daily.    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  Social History Social History   Socioeconomic History   Marital status: Single    Spouse name: Not on file   Number of children: Not on file   Years of education: Not on file   Highest education level: Not on file  Occupational History   Not on file  Tobacco Use   Smoking status: Former    Current packs/day: 1.00    Average packs/day: 1 pack/day for 22.0 years (22.0 ttl pk-yrs)    Types: Cigarettes   Smokeless tobacco: Never  Substance and Sexual Activity   Alcohol use: No   Drug use: No   Sexual activity: Not on file  Other Topics Concern   Not on file  Social History Narrative   Not on file   Social Drivers of Health   Financial Resource Strain: Not on File (03/08/2022)    Received from [employer]    Financial Resource Strain: 0  Food Insecurity: Not on File (03/08/2022)   Received from [place], [place], [place]   Food Insecurity    Food: 0  Transportation Needs: Not on File (03/08/2022)   Received from [employer] Needs    Transportation: 0  Physical Activity: Not on File (03/08/2022)   Received from [facility]   Physical Activity    Physical Activity: 0  Recent Concern: Physical Activity - At Risk (03/08/2022)   Received from [facility]   Physical Activity    Physical Activity: 2  Stress: Not on File (03/08/2022)   Received from [facility]   Stress    Stress: 0  Social Connections: Not on File (03/08/2022)   Received from [employer] [facility]   Social Connections    Connectedness: 0  Intimate Partner Violence: Not on file    Lab Results  Component Value Date  HGBA1C 9.9 (H) 11/20/2023   HGBA1C 8.4 (H) 02/07/2014   HGBA1C 7.1 (H) 09/02/2013   Lab Results  Component Value Date   CHOL 128 11/20/2023   Lab Results  Component Value Date   HDL 52 11/20/2023   Lab Results  Component Value Date   LDLCALC 44 11/20/2023   Lab Results  Component Value Date   TRIG 275 (H) 11/20/2023   Lab Results  Component Value Date   CHOLHDL 2.5 11/20/2023   Lab Results  Component Value Date   CREATININE 0.63 11/20/2023   No results found for: "GFR" Lab Results  Component Value Date   MICROALBUR 1.1 11/20/2023      Component Value Date/Time   NA 140 11/20/2023 1406   K 3.7 11/20/2023 1406   CL 101 11/20/2023 1406   CO2 29 11/20/2023 1406   GLUCOSE 109 (H) 11/20/2023 1406   BUN 17 11/20/2023 1406   CREATININE 0.63 11/20/2023 1406   CALCIUM 10.1 11/20/2023 1406   PROT 7.1 11/20/2023 1406   ALBUMIN 3.5 05/19/2018 1301   AST 12 11/20/2023 1406   ALT 10 11/20/2023 1406   ALKPHOS 86 05/19/2018 1301   BILITOT 0.3 11/20/2023 1406   GFRNONAA >60 05/19/2018 1301   GFRNONAA >89 09/02/2013 1255   GFRAA >60 05/19/2018 1301   GFRAA >89 09/02/2013  1255      Latest Ref Rng & Units 11/20/2023    2:06 PM 05/19/2018    1:01 PM 02/07/2014    3:28 PM  BMP  Glucose 65 - 99 mg/dL 161  096  045   BUN 7 - 25 mg/dL 17  11  10    Creatinine 0.50 - 1.03 mg/dL 4.09  8.11  9.14   BUN/Creat Ratio 6 - 22 (calc) SEE NOTE:     Sodium 135 - 146 mmol/L 140  140  138   Potassium 3.5 - 5.3 mmol/L 3.7  4.6  3.9   Chloride 98 - 110 mmol/L 101  106  104   CO2 20 - 32 mmol/L 29  25  24    Calcium 8.6 - 10.4 mg/dL 78.2  9.1  8.9        Component Value Date/Time   WBC 8.3 05/19/2018 1301   RBC 4.58 05/19/2018 1301   HGB 12.3 05/19/2018 1301   HCT 40.1 05/19/2018 1301   PLT 219 05/19/2018 1301   MCV 87.6 05/19/2018 1301   MCH 26.9 05/19/2018 1301   MCHC 30.7 05/19/2018 1301   RDW 15.9 (H) 05/19/2018 1301   LYMPHSABS 2.0 05/19/2018 1301   MONOABS 0.6 05/19/2018 1301   EOSABS 0.2 05/19/2018 1301   BASOSABS 0.0 05/19/2018 1301     Parts of this note may have been dictated using voice recognition software. There may be variances in spelling and vocabulary which are unintentional. Not all errors are proofread. Please notify the Thereasa Parkin if any discrepancies are noted or if the meaning of any statement is not clear.

## 2023-11-24 NOTE — Patient Instructions (Signed)
Take a bolus before all meals and snacks, making sure to put the insulin amount in the carb box, and to add blood sugar reading, press "add to calculator" and deliver. Treat low blood sugar with 1/2 cup of grape juice and wait 30 minutes before checking sensor reading.

## 2023-11-25 ENCOUNTER — Encounter: Payer: Self-pay | Admitting: "Endocrinology

## 2023-11-25 ENCOUNTER — Other Ambulatory Visit: Payer: Self-pay

## 2023-11-25 DIAGNOSIS — E1165 Type 2 diabetes mellitus with hyperglycemia: Secondary | ICD-10-CM

## 2023-11-25 MED ORDER — DAPAGLIFLOZIN PROPANEDIOL 10 MG PO TABS: ORAL_TABLET | ORAL | 3 refills | Status: DC

## 2023-11-26 ENCOUNTER — Telehealth: Payer: Self-pay

## 2023-11-26 ENCOUNTER — Other Ambulatory Visit: Payer: Self-pay

## 2023-11-26 NOTE — Telephone Encounter (Signed)
Patient is having trouble with putting in her carb count into her dash.

## 2023-11-27 ENCOUNTER — Other Ambulatory Visit: Payer: Self-pay

## 2023-11-27 DIAGNOSIS — E1165 Type 2 diabetes mellitus with hyperglycemia: Secondary | ICD-10-CM

## 2023-11-27 MED ORDER — FARXIGA 10 MG PO TABS: ORAL_TABLET | ORAL | 3 refills | Status: DC

## 2023-11-27 MED ORDER — DEXCOM G7 RECEIVER DEVI: 1.0000 | 1 refills | Status: DC

## 2023-12-02 NOTE — Telephone Encounter (Signed)
 LVM to call me back.  Reminded her that she will put in 15u acB, 5uacL and 20u acS into the carb box, and to remember to put the blood sugar reading into the bg box, and to put add to calculator and confirm and start.  Gave number to call if back if questions.

## 2023-12-05 ENCOUNTER — Encounter: Payer: Self-pay | Admitting: "Endocrinology

## 2023-12-08 ENCOUNTER — Encounter: Payer: Self-pay | Admitting: "Endocrinology

## 2023-12-10 ENCOUNTER — Other Ambulatory Visit: Payer: Self-pay

## 2023-12-10 DIAGNOSIS — E1165 Type 2 diabetes mellitus with hyperglycemia: Secondary | ICD-10-CM

## 2023-12-10 MED ORDER — BAQSIMI ONE PACK 3 MG/DOSE NA POWD: 1.0000 | NASAL | 3 refills | Status: AC | PRN

## 2023-12-10 MED ORDER — LANTUS SOLOSTAR 100 UNIT/ML ~~LOC~~ SOPN: 24.0000 [IU] | PEN_INJECTOR | Freq: Every day | SUBCUTANEOUS | 99 refills | Status: AC

## 2023-12-15 ENCOUNTER — Telehealth: Payer: Self-pay

## 2023-12-15 ENCOUNTER — Other Ambulatory Visit (HOSPITAL_COMMUNITY): Payer: Self-pay

## 2023-12-15 ENCOUNTER — Encounter: Payer: Self-pay | Admitting: "Endocrinology

## 2023-12-15 DIAGNOSIS — F4310 Post-traumatic stress disorder, unspecified: Secondary | ICD-10-CM | POA: Diagnosis not present

## 2023-12-15 DIAGNOSIS — F3173 Bipolar disorder, in partial remission, most recent episode manic: Secondary | ICD-10-CM | POA: Diagnosis not present

## 2023-12-15 NOTE — Telephone Encounter (Signed)
 Pharmacy Patient Advocate Encounter   Received notification from Pt Calls Messages that prior authorization for Omnipod dash is required/requested.   Insurance verification completed.   The patient is insured through [facility] .   Per test claim: PA required; PA submitted to above mentioned insurance via CoverMyMeds Key/confirmation #/EOC [id number] Status is pending

## 2023-12-15 NOTE — Telephone Encounter (Signed)
 Pt needs a PA for [employer]

## 2023-12-16 NOTE — Telephone Encounter (Signed)
 Pharmacy Patient Advocate Encounter  Received notification from [facility] that Prior Authorization for Omnipod dash has been APPROVED through 12/14/2024   PA #/Case ID/Reference #: [id number]

## 2023-12-22 DIAGNOSIS — L820 Inflamed seborrheic keratosis: Secondary | ICD-10-CM | POA: Diagnosis not present

## 2023-12-22 DIAGNOSIS — L570 Actinic keratosis: Secondary | ICD-10-CM | POA: Diagnosis not present

## 2023-12-22 DIAGNOSIS — L209 Atopic dermatitis, unspecified: Secondary | ICD-10-CM | POA: Diagnosis not present

## 2023-12-22 DIAGNOSIS — L299 Pruritus, unspecified: Secondary | ICD-10-CM | POA: Diagnosis not present

## 2023-12-25 DIAGNOSIS — F322 Major depressive disorder, single episode, severe without psychotic features: Secondary | ICD-10-CM | POA: Diagnosis not present

## 2023-12-25 DIAGNOSIS — Z6841 Body Mass Index (BMI) 40.0 and over, adult: Secondary | ICD-10-CM | POA: Diagnosis not present

## 2023-12-25 DIAGNOSIS — J449 Chronic obstructive pulmonary disease, unspecified: Secondary | ICD-10-CM | POA: Diagnosis not present

## 2023-12-25 DIAGNOSIS — N951 Menopausal and female climacteric states: Secondary | ICD-10-CM | POA: Diagnosis not present

## 2023-12-25 DIAGNOSIS — E1165 Type 2 diabetes mellitus with hyperglycemia: Secondary | ICD-10-CM | POA: Diagnosis not present

## 2023-12-31 DIAGNOSIS — F332 Major depressive disorder, recurrent severe without psychotic features: Secondary | ICD-10-CM | POA: Diagnosis not present

## 2023-12-31 MED ORDER — OMNIPOD DASH INTRO (GEN 4) KIT: PACK | 0 refills | Status: AC

## 2023-12-31 NOTE — Addendum Note (Signed)
 Addended by: Beverely Pace on: 12/31/2023 09:24 AM   Modules accepted: Orders

## 2024-01-12 DIAGNOSIS — F3173 Bipolar disorder, in partial remission, most recent episode manic: Secondary | ICD-10-CM | POA: Diagnosis not present

## 2024-01-12 DIAGNOSIS — F4310 Post-traumatic stress disorder, unspecified: Secondary | ICD-10-CM | POA: Diagnosis not present

## 2024-01-13 DIAGNOSIS — B308 Other viral conjunctivitis: Secondary | ICD-10-CM | POA: Diagnosis not present

## 2024-01-21 DIAGNOSIS — F332 Major depressive disorder, recurrent severe without psychotic features: Secondary | ICD-10-CM | POA: Diagnosis not present

## 2024-01-22 ENCOUNTER — Other Ambulatory Visit: Payer: Self-pay | Admitting: "Endocrinology

## 2024-02-09 DIAGNOSIS — F3173 Bipolar disorder, in partial remission, most recent episode manic: Secondary | ICD-10-CM | POA: Diagnosis not present

## 2024-02-09 DIAGNOSIS — F4310 Post-traumatic stress disorder, unspecified: Secondary | ICD-10-CM | POA: Diagnosis not present

## 2024-02-10 DIAGNOSIS — F332 Major depressive disorder, recurrent severe without psychotic features: Secondary | ICD-10-CM | POA: Diagnosis not present

## 2024-02-23 ENCOUNTER — Ambulatory Visit: Payer: Medicare Other | Admitting: "Endocrinology

## 2024-02-23 ENCOUNTER — Encounter: Payer: Self-pay | Admitting: "Endocrinology

## 2024-02-23 VITALS — BP 114/70 | HR 83 | Ht 62.0 in | Wt 203.0 lb

## 2024-02-23 DIAGNOSIS — E782 Mixed hyperlipidemia: Secondary | ICD-10-CM

## 2024-02-23 DIAGNOSIS — Z7984 Long term (current) use of oral hypoglycemic drugs: Secondary | ICD-10-CM | POA: Diagnosis not present

## 2024-02-23 DIAGNOSIS — E1165 Type 2 diabetes mellitus with hyperglycemia: Secondary | ICD-10-CM | POA: Diagnosis not present

## 2024-02-23 DIAGNOSIS — Z9641 Presence of insulin pump (external) (internal): Secondary | ICD-10-CM | POA: Diagnosis not present

## 2024-02-23 LAB — POCT GLYCOSYLATED HEMOGLOBIN (HGB A1C): Hemoglobin A1C: 8 % — AB (ref 4.0–5.6)

## 2024-02-23 MED ORDER — PIOGLITAZONE HCL 15 MG PO TABS: 15.0000 mg | ORAL_TABLET | Freq: Every day | ORAL | 1 refills | Status: DC

## 2024-02-23 MED ORDER — GABAPENTIN 300 MG PO CAPS: 300.0000 mg | ORAL_CAPSULE | Freq: Three times a day (TID) | ORAL | 1 refills | Status: DC

## 2024-02-23 MED ORDER — HUMALOG 100 UNIT/ML IJ SOLN: INTRAMUSCULAR | 3 refills | Status: AC

## 2024-02-23 MED ORDER — PIOGLITAZONE HCL 30 MG PO TABS: 30.0000 mg | ORAL_TABLET | Freq: Every day | ORAL | 1 refills | Status: DC

## 2024-02-23 NOTE — Patient Instructions (Signed)

## 2024-02-23 NOTE — Progress Notes (Signed)
 Outpatient Endocrinology Note Michelle Newcomer, MD  02/23/24   Sherrod Dolphin 28-Jun-1966 [id number]  Referring Provider: [place], Sarah T, MD Primary Care Provider: [place], Sarah T, MD Reason for consultation: Subjective   Assessment & Plan  Diagnoses and all orders for this visit:  Uncontrolled type 2 diabetes mellitus with hyperglycemia (HCC) -     POCT glycosylated hemoglobin (Hb A1C)  Insulin pump in place  Long term (current) use of oral hypoglycemic drugs  Mixed hypercholesterolemia and hypertriglyceridemia  Other orders -     Discontinue: pioglitazone  (ACTOS ) 30 MG tablet; Take 1 tablet (30 mg total) by mouth daily. -     HUMALOG  100 UNIT/ML injection; Inject upto 150 units a day by pump -     pioglitazone  (ACTOS ) 15 MG tablet; Take 1 tablet (15 mg total) by mouth daily. -     gabapentin (NEURONTIN) 300 MG capsule; Take 1 capsule (300 mg total) by mouth 3 (three) times daily.    Diabetes Type II complicated by neuropathy, No results found for: "GFR" Hba1c goal less than 7, current Hba1c is  Lab Results  Component Value Date   HGBA1C 8.0 (A) 02/23/2024   Will recommend the following: Metformin  1000mg  twice  Farxiga  10 mg every day  Pioglitazone  15 mg every day (will not dose escalate due to chronic pre-actos  B/L leg swelling) Lantus  for back up in case of pump failure  Omnipod dash-with U100 humalog  (needs changing pods every 1-2 days)  Basal rate: 1 u/hr 12am-9am (1.5 the rest of the day) Bolus 20-25 units 5 min before meals  IC 1:7 ISF 1:34 >130 Target 120 Correction above 130  02/23/24: Gabapentin 300 mg tid prn for neuropathy    No known contraindications/side effects to any of above medications Glucagon  discussed and prescribed with refills on 11/24/23    -Last LD and Tg are as follows: Lab Results  Component Value Date   LDLCALC 44 11/20/2023    Lab Results  Component Value Date   TRIG 275 (H) 11/20/2023   -On atorvastatin 20 mg QD -Follow  low fat diet and exercise   -Blood pressure goal <140/90 - Microalbumin/creatinine goal is < 30 -Last MA/Cr is as follows: Lab Results  Component Value Date   MICROALBUR 1.1 11/20/2023   -on ACE/ARB Losartan 100 mg every day  -diet changes including salt restriction -limit eating outside -counseled BP targets per standards of diabetes care -uncontrolled blood pressure can lead to retinopathy, nephropathy and cardiovascular and atherosclerotic heart disease  Reviewed and counseled on: -A1C target -Blood sugar targets -Complications of uncontrolled diabetes  -Checking blood sugar before meals and bedtime and bring log next visit -All medications with mechanism of action and side effects -Hypoglycemia management: rule of 15's, Glucagon  Emergency Kit and medical alert ID -low-carb low-fat plate-method diet -At least 20 minutes of physical activity per day -Annual dilated retinal eye exam and foot exam -compliance and follow up needs -follow up as scheduled or earlier if problem gets worse  Call if blood sugar is less than 70 or consistently above 250    Take a 15 gm snack of carbohydrate at bedtime before you go to sleep if your blood sugar is less than 100.    If you are going to fast after midnight for a test or procedure, ask your physician for instructions on how to reduce/decrease your insulin dose.    Call if blood sugar is less than 70 or consistently above 250  -Treating a low sugar  by rule of 15  (15 gms of sugar every 15 min until sugar is more than 70) If you feel your sugar is low, test your sugar to be sure If your sugar is low (less than 70), then take 15 grams of a fast acting Carbohydrate (3-4 glucose tablets or glucose gel or 4 ounces of juice or regular soda) Recheck your sugar 15 min after treating low to make sure it is more than 70 If sugar is still less than 70, treat again with 15 grams of carbohydrate          Don't drive the hour of hypoglycemia  If  unconscious/unable to eat or drink by mouth, use glucagon  injection or nasal spray baqsimi  and call 911. Can repeat again in 15 min if still unconscious.  Return in about 4 weeks (around 03/22/2024).   I have reviewed current medications, nurse's notes, allergies, vital signs, past medical and surgical history, family medical history, and social history for this encounter. Counseled patient on symptoms, examination findings, lab findings, imaging results, treatment decisions and monitoring and prognosis. The patient understood the recommendations and agrees with the treatment plan. All questions regarding treatment plan were fully answered.  Michelle Newcomer, MD  02/23/24    History of Present Illness Michelle Rasmussen is a 58 y.o. year old female who presents for follow up of Type II diabetes mellitus.  Sharvi Kasner was first diagnosed around 2005. Diabetes education +  Home diabetes regimen: Metformin  1000mg  twice  Farxiga  10 mg every day  Pioglitazone  15 mg every day (has chronic pre-actos  B/L leg swelling) Lantus  for back up in case of pump failure  Omnipod dash-with U100 humalog  (changes pods every 1-2 days)  Basal rate: 1 u/hr  Bolus 20-25 units 5 min during/after meals  IC 1:7 ISF 1:34 >130 Target 120 Correction above 130  COMPLICATIONS -  MI/Stroke -  retinopathy +  neuropathy -  nephropathy  SYMPTOMS REVIEWED + Polyuria, on HCTZ - Weight loss + Blurred vision  BLOOD SUGAR DATA  CGM interpretation: At today's visit, we reviewed her CGM downloads. The full report is scanned in the media. Reviewing the CGM trends, BG are elevated across the day and improves overnight.  Physical Exam  BP 114/70   Pulse 83   Ht 5\' 2"  (1.575 m)   Wt 203 lb (92.1 kg)   SpO2 95%   BMI 37.13 kg/m    Constitutional: well developed, well nourished Head: normocephalic, atraumatic Eyes: sclera anicteric, no redness Neck: supple Lungs: normal respiratory effort Neurology: alert and  oriented Skin: dry, no appreciable rashes Musculoskeletal: no appreciable defects Psychiatric: normal mood and affect Diabetic Foot Exam - Simple   Simple Foot Form Diabetic Foot exam was performed with the following findings: Yes 02/23/2024  2:43 PM  Visual Inspection No deformities, no ulcerations, no other skin breakdown bilaterally: Yes Sensation Testing Intact to touch and monofilament testing bilaterally: Yes Pulse Check Posterior Tibialis and Dorsalis pulse intact bilaterally: Yes Comments      Current Medications Patient's Medications  New Prescriptions   GABAPENTIN (NEURONTIN) 300 MG CAPSULE    Take 1 capsule (300 mg total) by mouth 3 (three) times daily.   PIOGLITAZONE  (ACTOS ) 15 MG TABLET    Take 1 tablet (15 mg total) by mouth daily.  Previous Medications   ACETAMINOPHEN  (TYLENOL ) 650 MG CR TABLET    Take 650 mg by mouth as needed.   ATORVASTATIN (LIPITOR) 20 MG TABLET       CELECOXIB (CELEBREX)  100 MG CAPSULE    Take 100 mg by mouth 2 (two) times daily.   CHLORHEXIDINE (PERIDEX) 0.12 % SOLUTION       CITALOPRAM  (CELEXA ) 40 MG TABLET    TAKE ONE TABLET BY MOUTH ONCE DAILY   CLOBETASOL (TEMOVATE) 0.05 % EXTERNAL SOLUTION    Apply 1 Application topically as needed.   CONTINUOUS BLOOD GLUC SENSOR (FREESTYLE LIBRE 2 SENSOR) MISC    See admin instructions.   CONTINUOUS GLUCOSE RECEIVER (DEXCOM G7 RECEIVER) DEVI    Inject 1 Device into the skin continuous.   DICLOFENAC SODIUM (VOLTAREN) 1 % GEL    SMARTSIG:2-4 Gram(s) Topical 4 Times Daily PRN   DULOXETINE (CYMBALTA) 30 MG CAPSULE    Take by mouth.   DUPILUMAB [place]    Inject into the skin. EVERY 2 WEEKS   FAMOTIDINE  (PEPCID ) 40 MG TABLET    Take 1 tablet (40 mg total) by mouth daily for 14 days.   FARXIGA  10 MG TABS TABLET    Take 1 tab by mouth daily   FLUCONAZOLE  (DIFLUCAN ) 150 MG TABLET    Take 1 tablet (150 mg total) by mouth once a week.   FLUOXETINE (PROZAC) 20 MG CAPSULE    Take 60 mg by mouth every morning.    FLUOXETINE (PROZAC) 40 MG CAPSULE       GLUCAGON  (BAQSIMI  ONE PACK) 3 MG/DOSE POWD    Place 1 Device into the nose as needed (Low blood sugar with impaired consciousness).   HYDROCODONE-ACETAMINOPHEN  (NORCO/VICODIN) 5-325 MG TABLET    Take 1 tablet by mouth every 4 (four) hours as needed.   HYDROXYZINE (ATARAX) 25 MG TABLET    Take 25 mg by mouth 2 (two) times daily.   HYDROXYZINE (ATARAX) 50 MG TABLET    Take 50 mg by mouth at bedtime.   IBUPROFEN (ADVIL) 200 MG TABLET    Take by mouth as needed.   INSULIN DISPOSABLE PUMP (OMNIPOD DASH INTRO, GEN 4,) KIT    1 Device by Does not apply route continuous. Change pod every 2 days   INSULIN DISPOSABLE PUMP (OMNIPOD DASH PODS, GEN 4,) MISC    CHANGE POD EVERY 2 DAYS AS DIRRECTED BY MD   INSULIN GLARGINE  (LANTUS  SOLOSTAR) 100 UNIT/ML SOLOSTAR PEN    Inject 24 Units into the skin daily. In case of pump failure   LITHIUM CARBONATE (LITHOBID) 300 MG ER TABLET    Take 300 mg by mouth at bedtime.   LOSARTAN-HYDROCHLOROTHIAZIDE (HYZAAR) 50-12.5 MG TABLET       MELOXICAM (MOBIC) 15 MG TABLET       METFORMIN  (GLUCOPHAGE ) 1000 MG TABLET    Take 1 tablet (1,000 mg total) by mouth 2 (two) times daily.   MUPIROCIN OINTMENT (BACTROBAN) 2 %       NEOMYCIN -POLYMYXIN-HYDROCORTISONE (CORTISPORIN) 1 % SOLN OTIC SOLUTION    Apply 1-2 drops to toes once daily at bedtime   ONDANSETRON  (ZOFRAN ) 8 MG TABLET    Take 8 mg by mouth every 8 (eight) hours as needed.   OXYBUTYNIN (DITROPAN-XL) 10 MG 24 HR TABLET    Take 10 mg by mouth daily.   PANTOPRAZOLE (PROTONIX) 40 MG TABLET    SMARTSIG:1 Tablet(s) By Mouth Morning-Evening   PHENTERMINE (ADIPEX-P) 37.5 MG TABLET       PROMETHAZINE -DEXTROMETHORPHAN (PROMETHAZINE -DM) 6.25-15 MG/5ML SYRUP    Take 5 mLs by mouth 4 (four) times daily as needed for cough.   SOLIFENACIN (VESICARE) 10 MG TABLET    Take 5 mg by mouth  daily.   SULFAMETHOXAZOLE -TRIMETHOPRIM  (BACTRIM ) 400-80 MG TABLET    Take 1 tablet by mouth 2 (two) times daily.    TRAZODONE (DESYREL) 50 MG TABLET    Take by mouth.   TRIAMCINOLONE OINTMENT (KENALOG) 0.1 %    Apply topically.  Modified Medications   Modified Medication Previous Medication   HUMALOG  100 UNIT/ML INJECTION HUMALOG  100 UNIT/ML injection      Inject upto 150 units a day by pump    Inject upto 120 units a day by pump  Discontinued Medications   GABAPENTIN (NEURONTIN) 600 MG TABLET    Take 1,200 mg by mouth 3 (three) times daily.   PIOGLITAZONE  (ACTOS ) 15 MG TABLET    Take 1 tablet (15 mg total) by mouth daily.   PREGABALIN (LYRICA) 100 MG CAPSULE    Take 100 mg by mouth 2 (two) times daily.    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  Social History Social History   Socioeconomic History   Marital status: Single    Spouse name: Not on file   Number of children: Not on file   Years of education: Not on file   Highest education level: Not on file  Occupational History   Not on file  Tobacco Use   Smoking status: Former    Current packs/day: 1.00    Average packs/day: 1 pack/day for 22.0 years (22.0 ttl pk-yrs)    Types: Cigarettes   Smokeless tobacco: Never  Substance and Sexual Activity   Alcohol use: No   Drug use: No   Sexual activity: Not on file  Other Topics Concern   Not on file  Social History Narrative   Not on file    Social Drivers of Health   Financial Resource Strain: Not at Risk (12/25/2023)   Received from [facility]    Financial Resource Strain: 1  Food Insecurity: Not at Risk (12/25/2023)   Received from [facility] Insecurity    Food: 1  Transportation Needs: Not at Risk (12/25/2023)   Received from [facility] Needs    Transportation: 1  Physical Activity: At Risk (12/25/2023)   Received from [facility]   Physical Activity    Physical Activity: 2  Stress: At Risk (12/25/2023)   Received from [facility]   Stress    Stress: 2  Social Connections: Not at Risk (12/25/2023)   Received from [facility]   Social Connections    Connectedness: 1  Intimate Partner Violence: Not on file    Lab Results  Component Value Date   HGBA1C 8.0 (A) 02/23/2024   HGBA1C 9.9 (H) 11/20/2023   HGBA1C 8.4 (H) 02/07/2014   Lab Results  Component Value Date  CHOL 128 11/20/2023   Lab Results  Component Value Date   HDL 52 11/20/2023   Lab Results  Component Value Date   LDLCALC 44 11/20/2023   Lab Results  Component Value Date   TRIG 275 (H) 11/20/2023   Lab Results  Component Value Date   CHOLHDL 2.5 11/20/2023   Lab Results  Component Value Date   CREATININE 0.63 11/20/2023   No results found for: "GFR" Lab Results  Component Value Date   MICROALBUR 1.1 11/20/2023      Component Value Date/Time   NA 140 11/20/2023 1406   K 3.7 11/20/2023 1406   CL 101 11/20/2023 1406   CO2 29 11/20/2023 1406   GLUCOSE 109 (H) 11/20/2023 1406   BUN 17 11/20/2023 1406   CREATININE 0.63 11/20/2023 1406   CALCIUM 10.1 11/20/2023 1406   PROT 7.1 11/20/2023 1406   ALBUMIN 3.5 05/19/2018 1301   AST 12 11/20/2023 1406   ALT 10 11/20/2023 1406   ALKPHOS 86 05/19/2018 1301   BILITOT 0.3 11/20/2023 1406   GFRNONAA >60 05/19/2018 1301   GFRNONAA >89 09/02/2013 1255   GFRAA >60 05/19/2018 1301   GFRAA >89 09/02/2013 1255      Latest Ref Rng & Units 11/20/2023    2:06 PM  05/19/2018    1:01 PM 02/07/2014    3:28 PM  BMP  Glucose 65 - 99 mg/dL 161  096  045   BUN 7 - 25 mg/dL 17  11  10    Creatinine 0.50 - 1.03 mg/dL 4.09  8.11  9.14   BUN/Creat Ratio 6 - 22 (calc) SEE NOTE:     Sodium 135 - 146 mmol/L 140  140  138   Potassium 3.5 - 5.3 mmol/L 3.7  4.6  3.9   Chloride 98 - 110 mmol/L 101  106  104   CO2 20 - 32 mmol/L 29  25  24    Calcium 8.6 - 10.4 mg/dL 78.2  9.1  8.9        Component Value Date/Time   WBC 8.3 05/19/2018 1301   RBC 4.58 05/19/2018 1301   HGB 12.3 05/19/2018 1301   HCT 40.1 05/19/2018 1301   PLT 219 05/19/2018 1301   MCV 87.6 05/19/2018 1301   MCH 26.9 05/19/2018 1301   MCHC 30.7 05/19/2018 1301   RDW 15.9 (H) 05/19/2018 1301   LYMPHSABS 2.0 05/19/2018 1301   MONOABS 0.6 05/19/2018 1301   EOSABS 0.2 05/19/2018 1301   BASOSABS 0.0 05/19/2018 1301     Parts of this note may have been dictated using voice recognition software. There may be variances in spelling and vocabulary which are unintentional. Not all errors are proofread. Please notify the [facility] if any discrepancies are noted or if the meaning of any statement is not clear.

## 2024-02-24 ENCOUNTER — Telehealth: Payer: Self-pay | Admitting: Nutrition

## 2024-02-24 ENCOUNTER — Encounter: Payer: Self-pay | Admitting: "Endocrinology

## 2024-03-02 ENCOUNTER — Encounter: Attending: Nurse Practitioner | Admitting: Nutrition

## 2024-03-02 DIAGNOSIS — Z794 Long term (current) use of insulin: Secondary | ICD-10-CM | POA: Diagnosis not present

## 2024-03-02 DIAGNOSIS — E119 Type 2 diabetes mellitus without complications: Secondary | ICD-10-CM | POA: Insufficient documentation

## 2024-03-02 NOTE — Progress Notes (Signed)
 Patient came in wanting me to transfer the data from her old PDM dash system to the new PDM Dash.  Settings were transferred per Dr. Idell Majestic last office note on 12/15/23 and old PDM.  Basal rate: MN: 1.0u/hr, 9AM: 1.5u/hr, Max basal:3.0u/hr, target: 120 with correction over 130, timing 4 hrs. I/Cratio  1.  PT. Is not counting carbs.  Never has done this and does not know how.  ISF: 34.  We reviewed how to give a bolus.  She says that she opens up the bolus menue and puts 25 in the total box if blood sugar is good, and 30 if high.  She was told the steps to give the bolus:  1. Put the amount of insulin in the carb box that you need for the meal,  2. Put current blood sugar reading in the BG box,  3  Press "add to calculator" 4P. Press "confirm" and 5. Press start

## 2024-03-02 NOTE — Patient Instructions (Signed)
 Give bolus: Press insulin vial Put amount of insulin needed for meat into carb box Put blood sugar reading into BG box Press 'add to calculator' Press confirm  Press start

## 2024-03-08 DIAGNOSIS — F4310 Post-traumatic stress disorder, unspecified: Secondary | ICD-10-CM | POA: Diagnosis not present

## 2024-03-08 DIAGNOSIS — F3173 Bipolar disorder, in partial remission, most recent episode manic: Secondary | ICD-10-CM | POA: Diagnosis not present

## 2024-03-15 DIAGNOSIS — Z124 Encounter for screening for malignant neoplasm of cervix: Secondary | ICD-10-CM | POA: Diagnosis not present

## 2024-03-15 DIAGNOSIS — Z01419 Encounter for gynecological examination (general) (routine) without abnormal findings: Secondary | ICD-10-CM | POA: Diagnosis not present

## 2024-03-16 DIAGNOSIS — L299 Pruritus, unspecified: Secondary | ICD-10-CM | POA: Diagnosis not present

## 2024-03-16 DIAGNOSIS — F330 Major depressive disorder, recurrent, mild: Secondary | ICD-10-CM | POA: Diagnosis not present

## 2024-03-18 DIAGNOSIS — Z6838 Body mass index (BMI) 38.0-38.9, adult: Secondary | ICD-10-CM | POA: Diagnosis not present

## 2024-03-18 DIAGNOSIS — L281 Prurigo nodularis: Secondary | ICD-10-CM | POA: Diagnosis not present

## 2024-03-18 DIAGNOSIS — E1142 Type 2 diabetes mellitus with diabetic polyneuropathy: Secondary | ICD-10-CM | POA: Diagnosis not present

## 2024-03-18 DIAGNOSIS — E1165 Type 2 diabetes mellitus with hyperglycemia: Secondary | ICD-10-CM | POA: Diagnosis not present

## 2024-03-18 DIAGNOSIS — Z23 Encounter for immunization: Secondary | ICD-10-CM | POA: Diagnosis not present

## 2024-03-31 DIAGNOSIS — F332 Major depressive disorder, recurrent severe without psychotic features: Secondary | ICD-10-CM | POA: Diagnosis not present

## 2024-04-05 DIAGNOSIS — F4310 Post-traumatic stress disorder, unspecified: Secondary | ICD-10-CM | POA: Diagnosis not present

## 2024-04-05 DIAGNOSIS — F3173 Bipolar disorder, in partial remission, most recent episode manic: Secondary | ICD-10-CM | POA: Diagnosis not present

## 2024-04-14 DIAGNOSIS — Z6838 Body mass index (BMI) 38.0-38.9, adult: Secondary | ICD-10-CM | POA: Diagnosis not present

## 2024-04-14 DIAGNOSIS — Z0000 Encounter for general adult medical examination without abnormal findings: Secondary | ICD-10-CM | POA: Diagnosis not present

## 2024-04-14 DIAGNOSIS — Z23 Encounter for immunization: Secondary | ICD-10-CM | POA: Diagnosis not present

## 2024-04-21 DIAGNOSIS — F332 Major depressive disorder, recurrent severe without psychotic features: Secondary | ICD-10-CM | POA: Diagnosis not present

## 2024-04-23 ENCOUNTER — Other Ambulatory Visit: Payer: Self-pay | Admitting: "Endocrinology

## 2024-04-23 ENCOUNTER — Encounter: Payer: Self-pay | Admitting: "Endocrinology

## 2024-04-23 DIAGNOSIS — E1165 Type 2 diabetes mellitus with hyperglycemia: Secondary | ICD-10-CM

## 2024-04-23 NOTE — Telephone Encounter (Signed)
 Requested Prescriptions   Pending Prescriptions Disp Refills   gabapentin  (NEURONTIN ) 300 MG capsule [Pharmacy Med Name: GABAPENTIN  300MG  CAPSULES] 90 capsule 1    Sig: TAKE 1 CAPSULE(300 MG) BY MOUTH THREE TIMES DAILY

## 2024-04-24 DIAGNOSIS — L281 Prurigo nodularis: Secondary | ICD-10-CM | POA: Diagnosis not present

## 2024-04-24 DIAGNOSIS — Z794 Long term (current) use of insulin: Secondary | ICD-10-CM | POA: Diagnosis not present

## 2024-04-24 DIAGNOSIS — E119 Type 2 diabetes mellitus without complications: Secondary | ICD-10-CM | POA: Diagnosis not present

## 2024-04-24 DIAGNOSIS — F17210 Nicotine dependence, cigarettes, uncomplicated: Secondary | ICD-10-CM | POA: Diagnosis not present

## 2024-04-24 DIAGNOSIS — Z79899 Other long term (current) drug therapy: Secondary | ICD-10-CM | POA: Diagnosis not present

## 2024-04-24 DIAGNOSIS — L02211 Cutaneous abscess of abdominal wall: Secondary | ICD-10-CM | POA: Diagnosis not present

## 2024-04-24 DIAGNOSIS — R1031 Right lower quadrant pain: Secondary | ICD-10-CM | POA: Diagnosis not present

## 2024-04-24 DIAGNOSIS — F32A Depression, unspecified: Secondary | ICD-10-CM | POA: Diagnosis not present

## 2024-04-24 DIAGNOSIS — I10 Essential (primary) hypertension: Secondary | ICD-10-CM | POA: Diagnosis not present

## 2024-04-24 DIAGNOSIS — Z79620 Long term (current) use of immunosuppressive biologic: Secondary | ICD-10-CM | POA: Diagnosis not present

## 2024-04-24 DIAGNOSIS — L03311 Cellulitis of abdominal wall: Secondary | ICD-10-CM | POA: Diagnosis not present

## 2024-04-24 DIAGNOSIS — Z7984 Long term (current) use of oral hypoglycemic drugs: Secondary | ICD-10-CM | POA: Diagnosis not present

## 2024-04-26 ENCOUNTER — Other Ambulatory Visit: Payer: Self-pay

## 2024-04-26 DIAGNOSIS — E1165 Type 2 diabetes mellitus with hyperglycemia: Secondary | ICD-10-CM

## 2024-04-26 MED ORDER — FARXIGA 10 MG PO TABS: ORAL_TABLET | ORAL | 3 refills | Status: DC

## 2024-05-04 DIAGNOSIS — F4310 Post-traumatic stress disorder, unspecified: Secondary | ICD-10-CM | POA: Diagnosis not present

## 2024-05-04 DIAGNOSIS — F3173 Bipolar disorder, in partial remission, most recent episode manic: Secondary | ICD-10-CM | POA: Diagnosis not present

## 2024-05-04 DIAGNOSIS — B3731 Acute candidiasis of vulva and vagina: Secondary | ICD-10-CM | POA: Diagnosis not present

## 2024-05-04 DIAGNOSIS — L02211 Cutaneous abscess of abdominal wall: Secondary | ICD-10-CM | POA: Diagnosis not present

## 2024-05-04 NOTE — Telephone Encounter (Signed)
 Appt. Scheduled for pump data transfer.

## 2024-05-07 ENCOUNTER — Other Ambulatory Visit: Payer: Self-pay | Admitting: "Endocrinology

## 2024-05-11 DIAGNOSIS — L02211 Cutaneous abscess of abdominal wall: Secondary | ICD-10-CM | POA: Diagnosis not present

## 2024-05-24 ENCOUNTER — Encounter: Payer: Self-pay | Admitting: "Endocrinology

## 2024-05-24 ENCOUNTER — Ambulatory Visit (INDEPENDENT_AMBULATORY_CARE_PROVIDER_SITE_OTHER): Admitting: "Endocrinology

## 2024-05-24 VITALS — BP 114/80 | HR 85 | Ht 62.0 in | Wt 212.0 lb

## 2024-05-24 DIAGNOSIS — Z7984 Long term (current) use of oral hypoglycemic drugs: Secondary | ICD-10-CM | POA: Diagnosis not present

## 2024-05-24 DIAGNOSIS — Z9641 Presence of insulin pump (external) (internal): Secondary | ICD-10-CM

## 2024-05-24 DIAGNOSIS — E782 Mixed hyperlipidemia: Secondary | ICD-10-CM | POA: Diagnosis not present

## 2024-05-24 DIAGNOSIS — E1165 Type 2 diabetes mellitus with hyperglycemia: Secondary | ICD-10-CM

## 2024-05-24 LAB — POCT GLYCOSYLATED HEMOGLOBIN (HGB A1C): Hemoglobin A1C: 6.8 % — AB (ref 4.0–5.6)

## 2024-05-24 NOTE — Progress Notes (Signed)
 Outpatient Endocrinology Note Michelle Birmingham, MD  05/24/24   Michelene Moros 06/02/1966 [id number]  Referring Provider: [place], Sarah T, MD Primary Care Provider: [place], Sarah T, MD Reason for consultation: Subjective   Assessment & Plan  Diagnoses and all orders for this visit:  Uncontrolled type 2 diabetes mellitus with hyperglycemia (HCC) -     POCT glycosylated hemoglobin (Hb A1C)  Insulin pump in place  Long term (current) use of oral hypoglycemic drugs  Mixed hypercholesterolemia and hypertriglyceridemia    Diabetes Type II complicated by neuropathy, No results found for: GFR Hba1c goal less than 7, current Hba1c is  Lab Results  Component Value Date   HGBA1C 6.8 (A) 05/24/2024   Will recommend the following: Metformin  1000mg  twice a day Farxiga  10 mg once  every day  Pioglitazone  15 mg every day (will not dose escalate due to chronic pre-actos  B/L leg swelling) Lantus  for back up in case of pump failure  Omnipod dash-with U100 humalog  (needs changing pods every 1-2 days)  Basal rate: 1 u/hr 12am-9am (1.5 the rest of the day) Bolus 20-25 units 5 min before meals  IC 1:7 ISF 1:34 >130 Target 120 Correction above 130 05/24/24: Patient is bolusing about 6 times a day for small meals, instructed to bolus with meals or if BG >200 and ideally for 3 meals a day with limited/no snacks to avoid insulin stacking leading to lows  02/23/24: Gabapentin  300 mg tid prn for neuropathy    No known contraindications/side effects to any of above medications Glucagon  discussed and prescribed with refills on 11/24/23    -Last LD and Tg are as follows: Lab Results  Component Value Date   LDLCALC 44 11/20/2023    Lab Results  Component Value Date   TRIG 275 (H) 11/20/2023   -On atorvastatin 20 mg QD -Follow low fat diet and exercise   -Blood pressure goal <140/90 - Microalbumin/creatinine goal is < 30 -Last MA/Cr is as follows: Lab Results  Component Value Date    MICROALBUR 1.1 11/20/2023   -on ACE/ARB Losartan 100 mg every day  -diet changes including salt restriction -limit eating outside -counseled BP targets per standards of diabetes care -uncontrolled blood pressure can lead to retinopathy, nephropathy and cardiovascular and atherosclerotic heart disease  Reviewed and counseled on: -A1C target -Blood sugar targets -Complications of uncontrolled diabetes  -Checking blood sugar before meals and bedtime and bring log next visit -All medications with mechanism of action and side effects -Hypoglycemia management: rule of 15's, Glucagon  Emergency Kit and medical alert ID -low-carb low-fat plate-method diet -At least 20 minutes of physical activity per day -Annual dilated retinal eye exam and foot exam -compliance and follow up needs -follow up as scheduled or earlier if problem gets worse  Call if blood sugar is less than 70 or consistently above 250    Take a 15 gm snack of carbohydrate at bedtime before you go to sleep if your blood sugar is less than 100.    If you are going to fast after midnight for a test or procedure, ask your physician for instructions on how to reduce/decrease your insulin dose.    Call if blood sugar is less than 70 or consistently above 250  -Treating a low sugar by rule of 15  (15 gms of sugar every 15 min until sugar is more than 70) If you feel your sugar is low, test your sugar to be sure If your sugar is low (less than 70), then  take 15 grams of a fast acting Carbohydrate (3-4 glucose tablets or glucose gel or 4 ounces of juice or regular soda) Recheck your sugar 15 min after treating low to make sure it is more than 70 If sugar is still less than 70, treat again with 15 grams of carbohydrate          Don't drive the hour of hypoglycemia  If unconscious/unable to eat or drink by mouth, use glucagon  injection or nasal spray baqsimi  and call 911. Can repeat again in 15 min if still unconscious.  Return in  about 4 weeks (around 06/21/2024).   I have reviewed current medications, nurse's notes, allergies, vital signs, past medical and surgical history, family medical history, and social history for this encounter. Counseled patient on symptoms, examination findings, lab findings, imaging results, treatment decisions and monitoring and prognosis. The patient understood the recommendations and agrees with the treatment plan. All questions regarding treatment plan were fully answered.  Michelle Birmingham, MD  05/24/24    History of Present Illness Gabriana Rasmussen is a 58 y.o. year old female who presents for follow up of Type II diabetes mellitus.  Herbert Aguinaldo was first diagnosed around 2005. Diabetes education +  Home diabetes regimen: Metformin  1000mg  twice  Farxiga  10 mg every day  Pioglitazone  15 mg every day (has chronic pre-actos  B/L leg swelling) Lantus  for back up in case of pump failure  Omnipod dash-with U100 humalog  (changes pods every 1-2 days)  Basal rate: 1 u/hr  Bolus 20-25 units 5 min during/after meals  IC 1:7 ISF 1:34 >130 Target 120 Correction above 130  COMPLICATIONS -  MI/Stroke -  retinopathy +  neuropathy -  nephropathy  BLOOD SUGAR DATA CGM interpretation: At today's visit, we reviewed her CGM downloads. The full report is scanned in the media. Reviewing the CGM trends, BG is normal mostly, with elevations and dips after meals.   Physical Exam  BP 114/80   Pulse 85   Ht 5' 2 (1.575 m)   Wt 212 lb (96.2 kg)   SpO2 98%   BMI 38.78 kg/m    Constitutional: well developed, well nourished Head: normocephalic, atraumatic Eyes: sclera anicteric, no redness Neck: supple Lungs: normal respiratory effort Neurology: alert and oriented Skin: dry, no appreciable rashes Musculoskeletal: no appreciable defects Psychiatric: normal mood and affect Diabetic Foot Exam - Simple   No data filed      Current Medications Patient's Medications  New Prescriptions    No medications on file  Previous Medications   ACETAMINOPHEN  (TYLENOL ) 650 MG CR TABLET    Take 650 mg by mouth as needed.   ATORVASTATIN (LIPITOR) 20 MG TABLET       CELECOXIB (CELEBREX) 100 MG CAPSULE    Take 100 mg by mouth 2 (two) times daily.   CHLORHEXIDINE (PERIDEX) 0.12 % SOLUTION       CITALOPRAM  (CELEXA ) 40 MG TABLET    TAKE ONE TABLET BY MOUTH ONCE DAILY   CLOBETASOL (TEMOVATE) 0.05 % EXTERNAL SOLUTION    Apply 1 Application topically as needed.   CONTINUOUS BLOOD GLUC SENSOR (FREESTYLE LIBRE 2 SENSOR) MISC    See admin instructions.   CONTINUOUS GLUCOSE SENSOR (DEXCOM G7 SENSOR) MISC    INJECT 1 DEVICE INTO SKIN EVERY 10 DAYS   DICLOFENAC SODIUM (VOLTAREN) 1 % GEL    SMARTSIG:2-4 Gram(s) Topical 4 Times Daily PRN   DULOXETINE (CYMBALTA) 30 MG CAPSULE    Take by mouth.   DUPILUMAB [place]  Inject into the skin. EVERY 2 WEEKS   FAMOTIDINE  (PEPCID ) 40 MG TABLET    Take 1 tablet (40 mg total) by mouth daily for 14 days.   FARXIGA  10 MG TABS TABLET    Take 1 tab by mouth daily   FLUCONAZOLE  (DIFLUCAN ) 150 MG TABLET    Take 1 tablet (150 mg total) by mouth once a week.   FLUOXETINE (PROZAC) 20 MG CAPSULE    Take 60 mg by mouth every morning.   FLUOXETINE (PROZAC) 40 MG CAPSULE       GABAPENTIN  (NEURONTIN ) 300 MG CAPSULE    TAKE 1 CAPSULE(300 MG) BY MOUTH THREE TIMES DAILY   GLUCAGON  (BAQSIMI  ONE PACK) 3 MG/DOSE POWD    Place 1 Device into the nose as needed (Low blood sugar with impaired consciousness).   HUMALOG  100 UNIT/ML INJECTION    Inject upto 150 units a day by pump   HYDROCODONE-ACETAMINOPHEN  (NORCO/VICODIN) 5-325 MG TABLET    Take 1 tablet by mouth every 4 (four) hours as needed.   HYDROXYZINE (ATARAX) 25 MG TABLET    Take 25 mg by mouth 2 (two) times daily.   HYDROXYZINE (ATARAX) 50 MG TABLET    Take 50 mg by mouth at bedtime.   IBUPROFEN (ADVIL) 200 MG TABLET    Take by mouth as needed.   INSULIN DISPOSABLE PUMP (OMNIPOD DASH INTRO, GEN 4,) KIT    1 Device by Does not apply  route continuous. Change pod every 2 days   INSULIN DISPOSABLE PUMP (OMNIPOD DASH PODS, GEN 4,) MISC    CHANGE POD EVERY 2 DAYS AS DIRRECTED BY MD   INSULIN GLARGINE  (LANTUS  SOLOSTAR) 100 UNIT/ML SOLOSTAR PEN    Inject 24 Units into the skin daily. In case of pump failure   LITHIUM CARBONATE (LITHOBID) 300 MG ER TABLET    Take 300 mg by mouth at bedtime.   LOSARTAN-HYDROCHLOROTHIAZIDE (HYZAAR) 50-12.5 MG TABLET       MELOXICAM (MOBIC) 15 MG TABLET       METFORMIN  (GLUCOPHAGE ) 1000 MG TABLET    Take 1 tablet (1,000 mg total) by mouth 2 (two) times daily.   MUPIROCIN OINTMENT (BACTROBAN) 2 %       NEOMYCIN -POLYMYXIN-HYDROCORTISONE (CORTISPORIN) 1 % SOLN OTIC SOLUTION    Apply 1-2 drops to toes once daily at bedtime   ONDANSETRON  (ZOFRAN ) 8 MG TABLET    Take 8 mg by mouth every 8 (eight) hours as needed.   OXYBUTYNIN (DITROPAN-XL) 10 MG 24 HR TABLET    Take 10 mg by mouth daily.   PANTOPRAZOLE (PROTONIX) 40 MG TABLET    SMARTSIG:1 Tablet(s) By Mouth Morning-Evening   PHENTERMINE (ADIPEX-P) 37.5 MG TABLET       PIOGLITAZONE  (ACTOS ) 15 MG TABLET    Take 1 tablet (15 mg total) by mouth daily.   PROMETHAZINE -DEXTROMETHORPHAN (PROMETHAZINE -DM) 6.25-15 MG/5ML SYRUP    Take 5 mLs by mouth 4 (four) times daily as needed for cough.   SOLIFENACIN (VESICARE) 10 MG TABLET    Take 5 mg by mouth daily.   SULFAMETHOXAZOLE -TRIMETHOPRIM  (BACTRIM ) 400-80 MG TABLET    Take 1 tablet by mouth 2 (two) times daily.   TRAZODONE (DESYREL) 50 MG TABLET    Take by mouth.   TRIAMCINOLONE OINTMENT (KENALOG) 0.1 %    Apply topically.  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  Social History Social History   Socioeconomic History   Marital status: Single    Spouse name: Not on file   Number of children: Not on file   Years of education: Not on file   Highest education level: Not on file  Occupational History   Not on file  Tobacco Use   Smoking status: Former    Current packs/day: 1.00    Average packs/day: 1 pack/day for 22.0 years (22.0 ttl pk-yrs)    Types: Cigarettes   Smokeless tobacco: Never  Substance and Sexual Activity   Alcohol use: No   Drug use: No   Sexual activity: Not on file  Other Topics Concern   Not on file  Social History Narrative   Not on file   Social Drivers of Health   Financial Resource Strain: Not at Risk (12/25/2023)   Received from [facility] Strain    How hard is it for you to pay for the very basics like food, housing, heating, medical care, and medications?: 1  Food Insecurity: Not at Risk (12/25/2023)   Received from [facility] Insecurity    Within the past 12 months, you worried that your food would run out before you got money to buy more.: 1  Transportation Needs: Not at Risk (12/25/2023)   Received from [facility] Needs    In the past 12 months, has lack of transportation kept you from medical appointments, meetings, work or from getting things needed for daily living?: 1  Physical Activity: At Risk (12/25/2023)   Received from [facility]   Physical Activity    On average, how many minutes do you engage in exercise at this level?: 2  Stress: At Risk (12/25/2023)   Received from [facility]    Stress    Do you feel these kinds of stress these days?: 2  Social Connections: Not at Risk (12/25/2023)   Received from [facility]   Social Connections    How often do you see or talk to people that you care about and feel close to? (For example: talking to friends on phone, visiting friends or family, going to church or club meetings): 1  Intimate Partner Violence: Not on file    Lab Results  Component Value Date   HGBA1C 6.8 (A) 05/24/2024   HGBA1C 8.0 (A) 02/23/2024   HGBA1C 9.9 (H) 11/20/2023   Lab Results  Component Value Date   CHOL 128 11/20/2023   Lab Results  Component Value Date   HDL 52 11/20/2023   Lab Results  Component Value Date   LDLCALC 44 11/20/2023   Lab Results  Component Value Date   TRIG 275 (H) 11/20/2023   Lab Results  Component Value Date  CHOLHDL 2.5 11/20/2023   Lab Results  Component Value Date   CREATININE 0.63 11/20/2023   No results found for: GFR Lab Results  Component Value Date   MICROALBUR 1.1 11/20/2023      Component Value Date/Time   NA 140 11/20/2023 1406   K 3.7 11/20/2023 1406   CL 101 11/20/2023 1406   CO2 29 11/20/2023 1406   GLUCOSE 109 (H) 11/20/2023 1406   BUN 17 11/20/2023 1406   CREATININE 0.63 11/20/2023 1406   CALCIUM 10.1 11/20/2023 1406   PROT 7.1 11/20/2023 1406   ALBUMIN 3.5 05/19/2018 1301   AST 12 11/20/2023 1406   ALT 10 11/20/2023 1406   ALKPHOS 86 05/19/2018 1301   BILITOT 0.3 11/20/2023 1406   GFRNONAA >60 05/19/2018 1301   GFRNONAA >89 09/02/2013 1255   GFRAA >60 05/19/2018 1301   GFRAA >89 09/02/2013 1255      Latest Ref Rng & Units 11/20/2023    2:06 PM 05/19/2018    1:01 PM 02/07/2014    3:28 PM  BMP  Glucose 65 - 99 mg/dL 890  746  842   BUN 7 - 25 mg/dL 17  11  10    Creatinine 0.50 - 1.03 mg/dL 9.36  9.28  9.43   BUN/Creat Ratio 6 - 22 (calc) SEE NOTE:     Sodium 135 - 146 mmol/L 140  140  138   Potassium 3.5 - 5.3 mmol/L 3.7  4.6  3.9   Chloride 98 - 110 mmol/L 101  106  104    CO2 20 - 32 mmol/L 29  25  24    Calcium 8.6 - 10.4 mg/dL 89.8  9.1  8.9        Component Value Date/Time   WBC 8.3 05/19/2018 1301   RBC 4.58 05/19/2018 1301   HGB 12.3 05/19/2018 1301   HCT 40.1 05/19/2018 1301   PLT 219 05/19/2018 1301   MCV 87.6 05/19/2018 1301   MCH 26.9 05/19/2018 1301   MCHC 30.7 05/19/2018 1301   RDW 15.9 (H) 05/19/2018 1301   LYMPHSABS 2.0 05/19/2018 1301   MONOABS 0.6 05/19/2018 1301   EOSABS 0.2 05/19/2018 1301   BASOSABS 0.0 05/19/2018 1301     Parts of this note may have been dictated using voice recognition software. There may be variances in spelling and vocabulary which are unintentional. Not all errors are proofread. Please notify the dino if any discrepancies are noted or if the meaning of any statement is not clear.

## 2024-05-24 NOTE — Patient Instructions (Signed)

## 2024-05-25 ENCOUNTER — Encounter: Payer: Self-pay | Admitting: "Endocrinology

## 2024-05-27 DIAGNOSIS — F332 Major depressive disorder, recurrent severe without psychotic features: Secondary | ICD-10-CM | POA: Diagnosis not present

## 2024-06-01 DIAGNOSIS — F3173 Bipolar disorder, in partial remission, most recent episode manic: Secondary | ICD-10-CM | POA: Diagnosis not present

## 2024-06-01 DIAGNOSIS — F4310 Post-traumatic stress disorder, unspecified: Secondary | ICD-10-CM | POA: Diagnosis not present

## 2024-06-02 DIAGNOSIS — K080 Exfoliation of teeth due to systemic causes: Secondary | ICD-10-CM | POA: Diagnosis not present

## 2024-06-16 DIAGNOSIS — M1711 Unilateral primary osteoarthritis, right knee: Secondary | ICD-10-CM | POA: Diagnosis not present

## 2024-06-17 ENCOUNTER — Other Ambulatory Visit: Payer: Self-pay | Admitting: "Endocrinology

## 2024-06-17 DIAGNOSIS — F332 Major depressive disorder, recurrent severe without psychotic features: Secondary | ICD-10-CM | POA: Diagnosis not present

## 2024-06-18 NOTE — Telephone Encounter (Signed)
 Requested Prescriptions   Pending Prescriptions Disp Refills   gabapentin  (NEURONTIN ) 300 MG capsule [Pharmacy Med Name: GABAPENTIN  300MG  CAPSULES] 90 capsule 1    Sig: TAKE 1 CAPSULE(300 MG) BY MOUTH THREE TIMES DAILY   metFORMIN  (GLUCOPHAGE ) 1000 MG tablet [Pharmacy Med Name: METFORMIN  1000MG  TABLETS] 60 tablet 3    Sig: TAKE 1 TABLET(1000 MG) BY MOUTH TWICE DAILY

## 2024-06-21 DIAGNOSIS — L281 Prurigo nodularis: Secondary | ICD-10-CM | POA: Diagnosis not present

## 2024-06-21 DIAGNOSIS — L299 Pruritus, unspecified: Secondary | ICD-10-CM | POA: Diagnosis not present

## 2024-06-21 DIAGNOSIS — L209 Atopic dermatitis, unspecified: Secondary | ICD-10-CM | POA: Diagnosis not present

## 2024-06-21 DIAGNOSIS — L820 Inflamed seborrheic keratosis: Secondary | ICD-10-CM | POA: Diagnosis not present

## 2024-06-24 DIAGNOSIS — F3173 Bipolar disorder, in partial remission, most recent episode manic: Secondary | ICD-10-CM | POA: Diagnosis not present

## 2024-06-24 DIAGNOSIS — F4310 Post-traumatic stress disorder, unspecified: Secondary | ICD-10-CM | POA: Diagnosis not present

## 2024-07-05 ENCOUNTER — Ambulatory Visit (INDEPENDENT_AMBULATORY_CARE_PROVIDER_SITE_OTHER): Admitting: "Endocrinology

## 2024-07-05 ENCOUNTER — Encounter: Payer: Self-pay | Admitting: "Endocrinology

## 2024-07-05 VITALS — BP 102/64 | HR 86 | Ht 62.0 in | Wt 214.0 lb

## 2024-07-05 DIAGNOSIS — Z9641 Presence of insulin pump (external) (internal): Secondary | ICD-10-CM | POA: Diagnosis not present

## 2024-07-05 DIAGNOSIS — Z794 Long term (current) use of insulin: Secondary | ICD-10-CM

## 2024-07-05 DIAGNOSIS — E782 Mixed hyperlipidemia: Secondary | ICD-10-CM

## 2024-07-05 DIAGNOSIS — E1165 Type 2 diabetes mellitus with hyperglycemia: Secondary | ICD-10-CM | POA: Diagnosis not present

## 2024-07-05 DIAGNOSIS — Z7984 Long term (current) use of oral hypoglycemic drugs: Secondary | ICD-10-CM

## 2024-07-05 NOTE — Progress Notes (Signed)
 Outpatient Endocrinology Note [name], MD  07/05/24   Michelle Rasmussen 21-Mar-1966 [id number]  Referring Provider: [place], Sarah T, MD Primary Care Provider: [place], Sarah T, MD Reason for consultation: Subjective   Assessment & Plan  Diagnoses and all orders for this visit:  Uncontrolled type 2 diabetes mellitus with hyperglycemia (HCC) -     Cancel: Lipid panel -     Cancel: Microalbumin / creatinine urine ratio -     Cancel: Comprehensive metabolic panel with GFR -     Comprehensive metabolic panel with GFR; Future -     Lipid panel; Future -     Microalbumin / creatinine urine ratio; Future  Insulin pump in place  Long term (current) use of oral hypoglycemic drugs  Insulin dose changed (HCC)  Mixed hypercholesterolemia and hypertriglyceridemia     Diabetes Type II complicated by neuropathy, No results found for: GFR Hba1c goal less than 7, current Hba1c is  Lab Results  Component Value Date   HGBA1C 6.8 (A) 05/24/2024   Will recommend the following: Metformin  1000mg  twice a day Farxiga  10 mg once every day  Pioglitazone  15 mg every day (will not dose escalate due to chronic pre-actos  B/L leg swelling) Lantus  for back up in case of pump failure  Omnipod dash-with U100 humalog  (needs changing pods every 1-2 days) with DexCom G7 Basal rate: 1 u/hr 12am-9am (1.5 the rest of the day) IC 1:1 ISF 1:34 >130 IOB 34 Target 120 Correction above 130 05/24/24: Patient is bolusing about 6 times a day for small meals, instructed to bolus with meals or if BG >200 and ideally for 3 meals a day with limited/no snacks to avoid insulin stacking leading to lows  07/05/24: 6-7 units before break fast and lunch and 7-8 units before supper five min before meals  02/23/24: Gabapentin  300 mg tid prn for neuropathy    No known contraindications/side effects to any of above medications Glucagon  discussed and prescribed with refills on 11/24/23    -Last LD and Tg are as  follows: Lab Results  Component Value Date   LDLCALC 44 11/20/2023    Lab Results  Component Value Date   TRIG 275 (H) 11/20/2023   -On atorvastatin 20 mg QD -Follow low fat diet and exercise   -Blood pressure goal <140/90 - Microalbumin/creatinine goal is < 30 -Last MA/Cr is as follows: Lab Results  Component Value Date   MICROALBUR 1.1 11/20/2023   -on ACE/ARB Losartan 100 mg every day  -diet changes including salt restriction -limit eating outside -counseled BP targets per standards of diabetes care -uncontrolled blood pressure can lead to retinopathy, nephropathy and cardiovascular and atherosclerotic heart disease  Reviewed and counseled on: -A1C target -Blood sugar targets -Complications of uncontrolled diabetes  -Checking blood sugar before meals and bedtime and bring log next visit -All medications with mechanism of action and side effects -Hypoglycemia management: rule of 15's, Glucagon  Emergency Kit and medical alert ID -low-carb low-fat plate-method diet -At least 20 minutes of physical activity per day -Annual dilated retinal eye exam and foot exam -compliance and follow up needs -follow up as scheduled or earlier if problem gets worse  Call if blood sugar is less than 70 or consistently above 250    Take a 15 gm snack of carbohydrate at bedtime before you go to sleep if your blood sugar is less than 100.    If you are going to fast after midnight for a test or procedure, ask your physician  for instructions on how to reduce/decrease your insulin dose.    Call if blood sugar is less than 70 or consistently above 250  -Treating a low sugar by rule of 15  (15 gms of sugar every 15 min until sugar is more than 70) If you feel your sugar is low, test your sugar to be sure If your sugar is low (less than 70), then take 15 grams of a fast acting Carbohydrate (3-4 glucose tablets or glucose gel or 4 ounces of juice or regular soda) Recheck your sugar 15 min after  treating low to make sure it is more than 70 If sugar is still less than 70, treat again with 15 grams of carbohydrate          Don't drive the hour of hypoglycemia  If unconscious/unable to eat or drink by mouth, use glucagon  injection or nasal spray baqsimi  and call 911. Can repeat again in 15 min if still unconscious.  Return in about 9 weeks (around 09/06/2024).   I have reviewed current medications, nurse's notes, allergies, vital signs, past medical and surgical history, family medical history, and social history for this encounter. Counseled patient on symptoms, examination findings, lab findings, imaging results, treatment decisions and monitoring and prognosis. The patient understood the recommendations and agrees with the treatment plan. All questions regarding treatment plan were fully answered.  [name], MD  07/05/24    History of Present Illness Michelle Rasmussen is a 58 y.o. year old female who presents for follow up of Type II diabetes mellitus.  Michelle Rasmussen was first diagnosed around 2005. Diabetes education +  Home diabetes regimen: Metformin  1000mg  twice  Farxiga  10 mg every day  Pioglitazone  15 mg every day (has chronic pre-actos  B/L leg swelling) Lantus  for back up in case of pump failure  Omnipod dash-with U100 humalog  (needs changing pods every 1-2 days) with DexCom G7 Basal rate: 1 u/hr 12am-9am (1.5 the rest of the day) Bolus 5 units 5 min before meals (fixed dose) IC 1:1 ISF 1:34 >130 IOB 34 Target 120 Correction above 130  COMPLICATIONS -  MI/Stroke -  retinopathy +  neuropathy -  nephropathy  BLOOD SUGAR DATA CGM interpretation: At today's visit, we reviewed her CGM downloads. The full report is scanned in the media. Reviewing the CGM trends, BG is normal mostly, with elevations and dips after meals.   Physical Exam  BP 102/64   Pulse 86   Ht 5' 2 (1.575 m)   Wt 214 lb (97.1 kg)   SpO2 96%   BMI 39.14 kg/m    Constitutional: well  developed, well nourished Head: normocephalic, atraumatic Eyes: sclera anicteric, no redness Neck: supple Lungs: normal respiratory effort Neurology: alert and oriented Skin: dry, no appreciable rashes Musculoskeletal: no appreciable defects Psychiatric: normal mood and affect Diabetic Foot Exam - Simple   No data filed      Current Medications Patient's Medications  New Prescriptions   No medications on file  Previous Medications   ACETAMINOPHEN  (TYLENOL ) 650 MG CR TABLET    Take 650 mg by mouth as needed.   ATORVASTATIN (LIPITOR) 20 MG TABLET       CELECOXIB (CELEBREX) 100 MG CAPSULE    Take 100 mg by mouth 2 (two) times daily.   CHLORHEXIDINE (PERIDEX) 0.12 % SOLUTION       CITALOPRAM  (CELEXA ) 40 MG TABLET    TAKE ONE TABLET BY MOUTH ONCE DAILY   CLOBETASOL (TEMOVATE) 0.05 % EXTERNAL SOLUTION  Apply 1 Application topically as needed.   CONTINUOUS BLOOD GLUC SENSOR (FREESTYLE LIBRE 2 SENSOR) MISC    See admin instructions.   CONTINUOUS GLUCOSE SENSOR (DEXCOM G7 SENSOR) MISC    INJECT 1 DEVICE INTO SKIN EVERY 10 DAYS   DICLOFENAC SODIUM (VOLTAREN) 1 % GEL    SMARTSIG:2-4 Gram(s) Topical 4 Times Daily PRN   DULOXETINE (CYMBALTA) 30 MG CAPSULE    Take by mouth.   DUPILUMAB [place]    Inject into the skin. EVERY 2 WEEKS   FAMOTIDINE  (PEPCID ) 40 MG TABLET    Take 1 tablet (40 mg total) by mouth daily for 14 days.   FARXIGA  10 MG TABS TABLET    Take 1 tab by mouth daily   FLUCONAZOLE  (DIFLUCAN ) 150 MG TABLET    Take 1 tablet (150 mg total) by mouth once a week.   FLUOXETINE (PROZAC) 20 MG CAPSULE    Take 60 mg by mouth every morning.   FLUOXETINE (PROZAC) 40 MG CAPSULE       GABAPENTIN  (NEURONTIN ) 300 MG CAPSULE    TAKE 1 CAPSULE(300 MG) BY MOUTH THREE TIMES DAILY   GLUCAGON  (BAQSIMI  ONE PACK) 3 MG/DOSE POWD    Place 1 Device into the nose as needed (Low blood sugar with impaired consciousness).   HUMALOG  100 UNIT/ML INJECTION    Inject upto 150 units a day by pump    HYDROCODONE-ACETAMINOPHEN  (NORCO/VICODIN) 5-325 MG TABLET    Take 1 tablet by mouth every 4 (four) hours as needed.   HYDROXYZINE (ATARAX) 25 MG TABLET    Take 25 mg by mouth 2 (two) times daily.   HYDROXYZINE (ATARAX) 50 MG TABLET    Take 50 mg by mouth at bedtime.   IBUPROFEN (ADVIL) 200 MG TABLET    Take by mouth as needed.   INSULIN DISPOSABLE PUMP (OMNIPOD DASH INTRO, GEN 4,) KIT    1 Device by Does not apply route continuous. Change pod every 2 days   INSULIN DISPOSABLE PUMP (OMNIPOD DASH PODS, GEN 4,) MISC    CHANGE POD EVERY 2 DAYS AS DIRRECTED BY MD   INSULIN GLARGINE  (LANTUS  SOLOSTAR) 100 UNIT/ML SOLOSTAR PEN    Inject 24 Units into the skin daily. In case of pump failure   LITHIUM  CARBONATE (LITHOBID) 300 MG ER TABLET    Take 300 mg by mouth at bedtime.   LOSARTAN-HYDROCHLOROTHIAZIDE (HYZAAR) 50-12.5 MG TABLET       MELOXICAM (MOBIC) 15 MG TABLET       METFORMIN  (GLUCOPHAGE ) 1000 MG TABLET    TAKE 1 TABLET(1000 MG) BY MOUTH TWICE DAILY   MUPIROCIN OINTMENT (BACTROBAN) 2 %       NEOMYCIN -POLYMYXIN-HYDROCORTISONE (CORTISPORIN) 1 % SOLN OTIC SOLUTION    Apply 1-2 drops to toes once daily at bedtime   ONDANSETRON  (ZOFRAN ) 8 MG TABLET    Take 8 mg by mouth every 8 (eight) hours as needed.   OXYBUTYNIN (DITROPAN-XL) 10 MG 24 HR TABLET    Take 10 mg by mouth daily.   PANTOPRAZOLE (PROTONIX) 40 MG TABLET    SMARTSIG:1 Tablet(s) By Mouth Morning-Evening   PHENTERMINE (ADIPEX-P) 37.5 MG TABLET       PIOGLITAZONE  (ACTOS ) 15 MG TABLET    Take 1 tablet (15 mg total) by mouth daily.   PROMETHAZINE -DEXTROMETHORPHAN (PROMETHAZINE -DM) 6.25-15 MG/5ML SYRUP    Take 5 mLs by mouth 4 (four) times daily as needed for cough.   SOLIFENACIN (VESICARE) 10 MG TABLET    Take 5 mg by mouth daily.  SULFAMETHOXAZOLE -TRIMETHOPRIM  (BACTRIM ) 400-80 MG TABLET    Take 1 tablet by mouth 2 (two) times daily.   TRAZODONE (DESYREL) 50 MG TABLET    Take by mouth.   TRIAMCINOLONE  OINTMENT (KENALOG) 0.1 %    Apply topically.   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  Social History Social History   Socioeconomic History   Marital status: Single    Spouse name: Not on file   Number of children: Not on file   Years of education: Not on file   Highest education level: Not on file  Occupational History   Not on file  Tobacco Use   Smoking status: Former    Current packs/day: 1.00    Average packs/day: 1 pack/day for 22.0 years (22.0 ttl pk-yrs)    Types: Cigarettes   Smokeless tobacco: Never  Substance and Sexual Activity   Alcohol use: No   Drug use: No   Sexual activity: Not on file  Other Topics Concern   Not on file  Social History Narrative   Not on file   Social Drivers of Health   Financial Resource Strain: Not at Risk (12/25/2023)   Received from [facility] [employer] Strain    How hard is it for you to pay for the very basics like food, housing, heating, medical care, and medications?: 1  Food Insecurity: Not at Risk (12/25/2023)   Received from [facility] Insecurity    Within the past 12 months, you worried that your food would run out before you got money to buy more.: 1  Transportation Needs: Not at Risk (12/25/2023)   Received from  [facility] Needs    In the past 12 months, has lack of transportation kept you from medical appointments, meetings, work or from getting things needed for daily living?: 1  Physical Activity: At Risk (12/25/2023)   Received from [facility]   Physical Activity    On average, how many minutes do you engage in exercise at this level?: 2  Stress: At Risk (12/25/2023)   Received from [facility]   Stress    Do you feel these kinds of stress these days?: 2  Social Connections: Not at Risk (12/25/2023)   Received from [facility]   Social Connections    How often do you see or talk to people that you care about and feel close to? (For example: talking to friends on phone, visiting friends or family, going to church or club meetings): 1  Intimate Partner Violence: Not on file    Lab Results  Component Value Date   HGBA1C 6.8 (A)  05/24/2024   HGBA1C 8.0 (A) 02/23/2024   HGBA1C 9.9 (H) 11/20/2023   Lab Results  Component Value Date   CHOL 128 11/20/2023   Lab Results  Component Value Date   HDL 52 11/20/2023   Lab Results  Component Value Date   LDLCALC 44 11/20/2023   Lab Results  Component Value Date   TRIG 275 (H) 11/20/2023   Lab Results  Component Value Date   CHOLHDL 2.5 11/20/2023   Lab Results  Component Value Date   CREATININE 0.63 11/20/2023   No results found for: GFR Lab Results  Component Value Date   MICROALBUR 1.1 11/20/2023      Component Value Date/Time   NA 140 11/20/2023 1406   K 3.7 11/20/2023 1406   CL 101 11/20/2023 1406   CO2 29 11/20/2023 1406   GLUCOSE 109 (H) 11/20/2023 1406   BUN 17 11/20/2023 1406   CREATININE 0.63 11/20/2023 1406   CALCIUM 10.1 11/20/2023 1406   PROT 7.1 11/20/2023 1406   ALBUMIN 3.5 05/19/2018 1301   AST 12 11/20/2023 1406   ALT 10 11/20/2023 1406   ALKPHOS 86 05/19/2018 1301   BILITOT 0.3 11/20/2023 1406   GFRNONAA >60 05/19/2018 1301   GFRNONAA >89 09/02/2013 1255   GFRAA >60 05/19/2018 1301   GFRAA >89  09/02/2013 1255      Latest Ref Rng & Units 11/20/2023    2:06 PM 05/19/2018    1:01 PM 02/07/2014    3:28 PM  BMP  Glucose 65 - 99 mg/dL 890  746  842   BUN 7 - 25 mg/dL 17  11  10    Creatinine 0.50 - 1.03 mg/dL 9.36  9.28  9.43   BUN/Creat Ratio 6 - 22 (calc) SEE NOTE:     Sodium 135 - 146 mmol/L 140  140  138   Potassium 3.5 - 5.3 mmol/L 3.7  4.6  3.9   Chloride 98 - 110 mmol/L 101  106  104   CO2 20 - 32 mmol/L 29  25  24    Calcium 8.6 - 10.4 mg/dL 89.8  9.1  8.9        Component Value Date/Time   WBC 8.3 05/19/2018 1301   RBC 4.58 05/19/2018 1301   HGB 12.3 05/19/2018 1301   HCT 40.1 05/19/2018 1301   PLT 219 05/19/2018 1301   MCV 87.6 05/19/2018 1301   MCH 26.9 05/19/2018 1301   MCHC 30.7 05/19/2018 1301   RDW 15.9 (H) 05/19/2018 1301   LYMPHSABS 2.0 05/19/2018 1301   MONOABS 0.6 05/19/2018 1301   EOSABS 0.2 05/19/2018 1301   BASOSABS 0.0 05/19/2018 1301     Parts of this note may have been dictated using voice recognition software. There may be variances in spelling and vocabulary which are unintentional. Not all errors are proofread. Please notify the dino if any discrepancies are noted or if the meaning of any statement is not clear.

## 2024-07-05 NOTE — Patient Instructions (Addendum)
 07/05/24: 6-7 units before break fast and lunch and 7-8 units before supper five min before meals

## 2024-07-19 DIAGNOSIS — L209 Atopic dermatitis, unspecified: Secondary | ICD-10-CM | POA: Diagnosis not present

## 2024-07-19 DIAGNOSIS — L299 Pruritus, unspecified: Secondary | ICD-10-CM | POA: Diagnosis not present

## 2024-07-19 DIAGNOSIS — L281 Prurigo nodularis: Secondary | ICD-10-CM | POA: Diagnosis not present

## 2024-07-28 DIAGNOSIS — Z79899 Other long term (current) drug therapy: Secondary | ICD-10-CM | POA: Diagnosis not present

## 2024-07-28 DIAGNOSIS — E559 Vitamin D deficiency, unspecified: Secondary | ICD-10-CM | POA: Diagnosis not present

## 2024-07-28 DIAGNOSIS — Z01818 Encounter for other preprocedural examination: Secondary | ICD-10-CM | POA: Diagnosis not present

## 2024-07-28 DIAGNOSIS — M79609 Pain in unspecified limb: Secondary | ICD-10-CM | POA: Diagnosis not present

## 2024-08-05 DIAGNOSIS — E119 Type 2 diabetes mellitus without complications: Secondary | ICD-10-CM | POA: Diagnosis not present

## 2024-08-05 DIAGNOSIS — L281 Prurigo nodularis: Secondary | ICD-10-CM | POA: Diagnosis not present

## 2024-08-05 DIAGNOSIS — M170 Bilateral primary osteoarthritis of knee: Secondary | ICD-10-CM | POA: Diagnosis not present

## 2024-08-05 DIAGNOSIS — I10 Essential (primary) hypertension: Secondary | ICD-10-CM | POA: Diagnosis not present

## 2024-08-05 DIAGNOSIS — Z6838 Body mass index (BMI) 38.0-38.9, adult: Secondary | ICD-10-CM | POA: Diagnosis not present

## 2024-08-10 DIAGNOSIS — F339 Major depressive disorder, recurrent, unspecified: Secondary | ICD-10-CM | POA: Diagnosis not present

## 2024-08-10 DIAGNOSIS — F319 Bipolar disorder, unspecified: Secondary | ICD-10-CM | POA: Diagnosis not present

## 2024-08-10 DIAGNOSIS — F3173 Bipolar disorder, in partial remission, most recent episode manic: Secondary | ICD-10-CM | POA: Diagnosis not present

## 2024-08-10 DIAGNOSIS — F4310 Post-traumatic stress disorder, unspecified: Secondary | ICD-10-CM | POA: Diagnosis not present

## 2024-08-16 ENCOUNTER — Other Ambulatory Visit: Payer: Self-pay | Admitting: "Endocrinology

## 2024-08-19 ENCOUNTER — Other Ambulatory Visit: Payer: Self-pay | Admitting: "Endocrinology

## 2024-08-28 ENCOUNTER — Other Ambulatory Visit: Payer: Self-pay | Admitting: "Endocrinology

## 2024-08-28 DIAGNOSIS — E1165 Type 2 diabetes mellitus with hyperglycemia: Secondary | ICD-10-CM

## 2024-08-30 NOTE — Telephone Encounter (Signed)
 Requested Prescriptions   Pending Prescriptions Disp Refills   FARXIGA  10 MG TABS tablet [Pharmacy Med Name: FARXIGA  10MG  TABLETS] 30 tablet 3    Sig: TAKE 1 TABLET BY MOUTH DAILY

## 2024-09-08 ENCOUNTER — Other Ambulatory Visit: Payer: Self-pay | Admitting: "Endocrinology

## 2024-09-09 ENCOUNTER — Encounter: Payer: Self-pay | Admitting: "Endocrinology

## 2024-09-09 ENCOUNTER — Ambulatory Visit (INDEPENDENT_AMBULATORY_CARE_PROVIDER_SITE_OTHER): Admitting: "Endocrinology

## 2024-09-09 VITALS — BP 130/80 | HR 74 | Ht 62.0 in | Wt 213.0 lb

## 2024-09-09 DIAGNOSIS — Z9641 Presence of insulin pump (external) (internal): Secondary | ICD-10-CM

## 2024-09-09 DIAGNOSIS — Z7984 Long term (current) use of oral hypoglycemic drugs: Secondary | ICD-10-CM

## 2024-09-09 DIAGNOSIS — E1165 Type 2 diabetes mellitus with hyperglycemia: Secondary | ICD-10-CM

## 2024-09-09 DIAGNOSIS — E782 Mixed hyperlipidemia: Secondary | ICD-10-CM

## 2024-09-09 DIAGNOSIS — Z794 Long term (current) use of insulin: Secondary | ICD-10-CM

## 2024-09-09 LAB — COMPREHENSIVE METABOLIC PANEL WITH GFR
ALT: 13 IU/L (ref 0–32)
AST: 17 IU/L (ref 0–40)
Albumin: 4 g/dL (ref 3.8–4.9)
Alkaline Phosphatase: 121 IU/L (ref 49–135)
BUN/Creatinine Ratio: 13 (ref 9–23)
BUN: 12 mg/dL (ref 6–24)
Bilirubin Total: <0.2 mg/dL (ref 0.0–1.2)
CO2: 22 mmol/L (ref 20–29)
Calcium: 9.1 mg/dL (ref 8.7–10.2)
Chloride: 102 mmol/L (ref 96–106)
Creatinine, Ser: 0.9 mg/dL (ref 0.57–1.00)
Globulin, Total: 2.5 g/dL (ref 1.5–4.5)
Glucose: 308 mg/dL — ABNORMAL HIGH (ref 70–99)
Potassium: 5.2 mmol/L (ref 3.5–5.2)
Sodium: 138 mmol/L (ref 134–144)
Total Protein: 6.5 g/dL (ref 6.0–8.5)
eGFR: 74 mL/min/1.73 (ref >59)

## 2024-09-09 LAB — POCT GLYCOSYLATED HEMOGLOBIN (HGB A1C): Hemoglobin A1C: 8.1 % — AB (ref 4.0–5.6)

## 2024-09-09 NOTE — Patient Instructions (Signed)

## 2024-09-09 NOTE — Progress Notes (Signed)
 Outpatient Endocrinology Note Michelle Birmingham, Rasmussen  09/09/24   Michelle Rasmussen [birth year] [id number]  Referring Provider: Jordan, Sarah T, Rasmussen Primary Care Provider: Jordan, Sarah T, Rasmussen Reason for consultation: Subjective   Assessment & Plan  Diagnoses and all orders for this visit:  Uncontrolled type 2 diabetes mellitus with hyperglycemia (HCC) -     POCT glycosylated hemoglobin (Hb A1C) -     Ambulatory referral to diabetic education  Insulin pump in place  Long term (current) use of oral hypoglycemic drugs  Insulin dose changed (HCC)  Mixed hypercholesterolemia and hypertriglyceridemia   Diabetes Type II complicated by neuropathy, No results found for: GFR Hba1c goal less than 7, current Hba1c is  Lab Results  Component Value Date   HGBA1C 8.1 (A) 09/09/2024   Will recommend the following: Metformin  1000mg  twice a day Farxiga  10 mg once every day  Pioglitazone  15 mg every day (will not dose escalate due to chronic pre-actos  B/L leg swelling) Lantus  for back up in case of pump failure  Omnipod dash-with U100 humalog  (needs changing pods every 1-2 days) with DexCom G7 Basal rate: 1 u/hr 12am-9am (1.5 the rest of the day) IC 1:1 ISF 1:34 >130 IOB 4 Target 120 Correction above 130 05/24/24: Patient is bolusing about 6 times a day for small meals, instructed to bolus with meals or if BG >200 and ideally for 3 meals a day with limited/no snacks to avoid insulin stacking leading to lows  07/05/24: 6-7 units before break fast and lunch and 7-8 units before supper five min before meals  09/09/2024: Patient is putting 9 g for her carbs during her bolus, instructed patient to put at least 12-15 g for her breakfast and lunch and 15-18gms for her supper: Requested diabetes educator to follow-up  02/23/24: Gabapentin  300 mg tid prn for neuropathy    No known contraindications/side effects to any of above medications Glucagon  discussed and prescribed with refills on 11/24/23     -Last LD and Tg are as follows: Lab Results  Component Value Date   LDLCALC 44 11/20/2023    Lab Results  Component Value Date   TRIG 275 (H) 11/20/2023   -On atorvastatin 20 mg QD -Follow low fat diet and exercise   -Blood pressure goal <140/90 - Microalbumin/creatinine goal is < 30 -Last MA/Cr is as follows: Lab Results  Component Value Date   MICROALBUR 1.1 11/20/2023   -on ACE/ARB Losartan 100 mg every day  -diet changes including salt restriction -limit eating outside -counseled BP targets per standards of diabetes care -uncontrolled blood pressure can lead to retinopathy, nephropathy and cardiovascular and atherosclerotic heart disease  Reviewed and counseled on: -A1C target -Blood sugar targets -Complications of uncontrolled diabetes  -Checking blood sugar before meals and bedtime and bring log next visit -All medications with mechanism of action and side effects -Hypoglycemia management: rule of 15's, Glucagon  Emergency Kit and medical alert ID -low-carb low-fat plate-method diet -At least 20 minutes of physical activity per day -Annual dilated retinal eye exam and foot exam -compliance and follow up needs -follow up as scheduled or earlier if problem gets worse  Call if blood sugar is less than 70 or consistently above 250    Take a 15 gm snack of carbohydrate at bedtime before you go to sleep if your blood sugar is less than 100.    If you are going to fast after midnight for a test or procedure, ask your physician for instructions on how to reduce/decrease  your insulin dose.    Call if blood sugar is less than 70 or consistently above 250  -Treating a low sugar by rule of 15  (15 gms of sugar every 15 min until sugar is more than 70) If you feel your sugar is low, test your sugar to be sure If your sugar is low (less than 70), then take 15 grams of a fast acting Carbohydrate (3-4 glucose tablets or glucose gel or 4 ounces of juice or regular  soda) Recheck your sugar 15 min after treating low to make sure it is more than 70 If sugar is still less than 70, treat again with 15 grams of carbohydrate          Don't drive the hour of hypoglycemia  If unconscious/unable to eat or drink by mouth, use glucagon  injection or nasal spray baqsimi  and call 911. Can repeat again in 15 min if still unconscious.  Return in about 4 weeks (around 10/07/2024).   I have reviewed current medications, nurse's notes, allergies, vital signs, past medical and surgical history, family medical history, and social history for this encounter. Counseled patient on symptoms, examination findings, lab findings, imaging results, treatment decisions and monitoring and prognosis. The patient understood the recommendations and agrees with the treatment plan. All questions regarding treatment plan were fully answered.  Michelle Birmingham, Rasmussen  09/09/24    History of Present Illness Michelle Rasmussen is a 58 y.o. year old female who presents for follow up of Type II diabetes mellitus.  Michelle Rasmussen was first diagnosed around 2005. Diabetes education +  Home diabetes regimen: Metformin  1000mg  twice  Farxiga  10 mg every day  Pioglitazone  15 mg every day (has chronic pre-actos  B/L leg swelling) Lantus  for back up in case of pump failure  Omnipod dash-with U100 humalog  (needs changing pods every 1-2 days) with DexCom G7 Basal rate: 1 u/hr 12am-9am (1.5 the rest of the day) Bolus 5 units 5 min before meals (fixed dose) IC 1:1 ISF 1:34 >130 IOB 34 Target 120 Correction above 130  COMPLICATIONS -  MI/Stroke -  retinopathy +  neuropathy -  nephropathy  BLOOD SUGAR DATA CGM interpretation: At today's visit, we reviewed her CGM downloads. The full report is scanned in the media. Reviewing the CGM trends, BG is high after each meal, particularly the last meal of the day. One day she had low but likely was a sensor change/connectivity issue as data is missing after the  low blood sugar event, it was a stand-alone episode and patient does not remember it  Physical Exam  BP 130/80   Pulse 74   Ht 5' 2 (1.575 m)   Wt 213 lb (96.6 kg)   SpO2 96%   BMI 38.96 kg/m    Constitutional: well developed, well nourished Head: normocephalic, atraumatic Eyes: sclera anicteric, no redness Neck: supple Lungs: normal respiratory effort Neurology: alert and oriented Skin: dry, no appreciable rashes Musculoskeletal: no appreciable defects Psychiatric: normal mood and affect Diabetic Foot Exam - Simple   No data filed      Current Medications Patient's Medications  New Prescriptions   No medications on file  Previous Medications   ACETAMINOPHEN  (TYLENOL ) 650 MG CR TABLET    Take 650 mg by mouth as needed.   ATORVASTATIN (LIPITOR) 20 MG TABLET       CELECOXIB (CELEBREX) 100 MG CAPSULE    Take 100 mg by mouth 2 (two) times daily.   CHLORHEXIDINE (PERIDEX) 0.12 % SOLUTION  CITALOPRAM  (CELEXA ) 40 MG TABLET    TAKE ONE TABLET BY MOUTH ONCE DAILY   CLOBETASOL (TEMOVATE) 0.05 % EXTERNAL SOLUTION    Apply 1 Application topically as needed.   CONTINUOUS BLOOD GLUC SENSOR (FREESTYLE LIBRE 2 SENSOR) MISC    See admin instructions.   CONTINUOUS GLUCOSE SENSOR (DEXCOM G7 SENSOR) MISC    INJECT 1 DEVICE INTO SKIN EVERY 10 DAYS   DICLOFENAC SODIUM (VOLTAREN) 1 % GEL    SMARTSIG:2-4 Gram(s) Topical 4 Times Daily PRN   DULOXETINE (CYMBALTA) 30 MG CAPSULE    Take by mouth.   DUPILUMAB [place]    Inject into the skin. EVERY 2 WEEKS   FAMOTIDINE  (PEPCID ) 40 MG TABLET    Take 1 tablet (40 mg total) by mouth daily for 14 days.   FARXIGA  10 MG TABS TABLET    TAKE 1 TABLET BY MOUTH DAILY   FLUCONAZOLE  (DIFLUCAN ) 150 MG TABLET    Take 1 tablet (150 mg total) by mouth once a week.   FLUOXETINE (PROZAC) 20 MG CAPSULE    Take 60 mg by mouth every morning.   FLUOXETINE (PROZAC) 40 MG CAPSULE       GABAPENTIN  (NEURONTIN ) 300 MG CAPSULE    TAKE 1 CAPSULE(300 MG) BY MOUTH THREE TIMES  DAILY   GLUCAGON  (BAQSIMI  ONE PACK) 3 MG/DOSE POWD    Place 1 Device into the nose as needed (Low blood sugar with impaired consciousness).   HUMALOG  100 UNIT/ML INJECTION    Inject upto 150 units a day by pump   HYDROCODONE-ACETAMINOPHEN  (NORCO/VICODIN) 5-325 MG TABLET    Take 1 tablet by mouth every 4 (four) hours as needed.   HYDROXYZINE (ATARAX) 25 MG TABLET    Take 25 mg by mouth 2 (two) times daily.   HYDROXYZINE (ATARAX) 50 MG TABLET    Take 50 mg by mouth at bedtime.   IBUPROFEN (ADVIL) 200 MG TABLET    Take by mouth as needed.   INSULIN DISPOSABLE PUMP (OMNIPOD DASH INTRO, GEN 4,) KIT    1 Device by Does not apply route continuous. Change pod every 2 days   INSULIN DISPOSABLE PUMP (OMNIPOD DASH PODS, GEN 4,) MISC    CHANGE POD EVERY 2 DAYS AS DIRRECTED BY Rasmussen   INSULIN GLARGINE  (LANTUS  SOLOSTAR) 100 UNIT/ML SOLOSTAR PEN    Inject 24 Units into the skin daily. In case of pump failure   LITHIUM  CARBONATE (LITHOBID) 300 MG ER TABLET    Take 300 mg by mouth at bedtime.   LOSARTAN-HYDROCHLOROTHIAZIDE (HYZAAR) 50-12.5 MG TABLET       MELOXICAM (MOBIC) 15 MG TABLET       METFORMIN  (GLUCOPHAGE ) 1000 MG TABLET    TAKE 1 TABLET(1000 MG) BY MOUTH TWICE DAILY   MUPIROCIN OINTMENT (BACTROBAN) 2 %       NEOMYCIN -POLYMYXIN-HYDROCORTISONE (CORTISPORIN) 1 % SOLN OTIC SOLUTION    Apply 1-2 drops to toes once daily at bedtime   ONDANSETRON  (ZOFRAN ) 8 MG TABLET    Take 8 mg by mouth every 8 (eight) hours as needed.   OXYBUTYNIN (DITROPAN-XL) 10 MG 24 HR TABLET    Take 10 mg by mouth daily.   PANTOPRAZOLE (PROTONIX) 40 MG TABLET    SMARTSIG:1 Tablet(s) By Mouth Morning-Evening   PHENTERMINE (ADIPEX-P) 37.5 MG TABLET       PIOGLITAZONE  (ACTOS ) 15 MG TABLET    Take 1 tablet (15 mg total) by mouth daily.   PROMETHAZINE -DEXTROMETHORPHAN (PROMETHAZINE -DM) 6.25-15 MG/5ML SYRUP    Take 5 mLs by  mouth 4 (four) times daily as needed for cough.   SOLIFENACIN (VESICARE) 10 MG TABLET    Take 5 mg by mouth daily.    SULFAMETHOXAZOLE -TRIMETHOPRIM  (BACTRIM ) 400-80 MG TABLET    Take 1 tablet by mouth 2 (two) times daily.   TRAZODONE (DESYREL) 50 MG TABLET    Take by mouth.   TRIAMCINOLONE  OINTMENT (KENALOG) 0.1 %    Apply topically.  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  Social History Social History   Socioeconomic History   Marital status: Single    Spouse name: Not on file   Number of children: Not on file   Years of education: Not on file   Highest education level: Not on file  Occupational History   Not on file  Tobacco Use   Smoking status: Former    Current packs/day: 1.00    Average packs/day: 1 pack/day for 22.0 years (22.0 ttl pk-yrs)    Types: Cigarettes   Smokeless tobacco: Never  Substance and Sexual Activity   Alcohol use: No   Drug use: No   Sexual activity: Not on file  Other Topics Concern   Not on file  Social History Narrative   Not on file   Social Drivers of Health   Financial Resource Strain: Not at Risk (12/25/2023)   Received from [facility] Strain    How hard is it for you to pay for the very basics like food, housing, heating, medical care, and medications?: 1  Food Insecurity: Not at Risk  (12/25/2023)   Received from [facility] Insecurity    Within the past 12 months, you worried that your food would run out before you got money to buy more.: 1  Transportation Needs: Not at Risk (12/25/2023)   Received from [facility] Needs    In the past 12 months, has lack of transportation kept you from medical appointments, meetings, work or from getting things needed for daily living?: 1  Physical Activity: At Risk (12/25/2023)   Received from [facility]   Physical Activity    On average, how many minutes do you engage in exercise at this level?: 2  Stress: At Risk (12/25/2023)   Received from [facility]   Stress    Do you feel these kinds of stress these days?: 2  Social Connections: Not at Risk (12/25/2023)   Received from [facility]   Social Connections    How often do you see or talk to people that you care about and feel close to? (For example: talking to friends on phone, visiting friends or family, going  to church or club meetings): 1  Intimate Partner Violence: Not on file    Lab Results  Component Value Date   HGBA1C 8.1 (A) 09/09/2024   HGBA1C 6.8 (A) 05/24/2024   HGBA1C 8.0 (A) 02/23/2024   Lab Results  Component Value Date   CHOL 128 11/20/2023   Lab Results  Component Value Date   HDL 52 11/20/2023   Lab Results  Component Value Date   LDLCALC 44 11/20/2023   Lab Results  Component Value Date   TRIG 275 (H) 11/20/2023   Lab Results  Component Value Date   CHOLHDL 2.5 11/20/2023   Lab Results  Component Value Date   CREATININE 0.90 09/08/2024   No results found for: GFR Lab Results  Component Value Date   MICROALBUR 1.1 11/20/2023      Component Value Date/Time   NA 138 09/08/2024 1623   K 5.2 09/08/2024 1623   CL 102 09/08/2024 1623   CO2 22 09/08/2024 1623   GLUCOSE 308 (H) 09/08/2024 1623   GLUCOSE 109 (H) 11/20/2023 1406   BUN 12 09/08/2024 1623   CREATININE 0.90 09/08/2024 1623   CREATININE 0.63 11/20/2023 1406   CALCIUM 9.1  09/08/2024 1623   PROT 6.5 09/08/2024 1623   ALBUMIN 4.0 09/08/2024 1623   AST 17 09/08/2024 1623   ALT 13 09/08/2024 1623   ALKPHOS 121 09/08/2024 1623   BILITOT <0.2 09/08/2024 1623   GFRNONAA >60 05/19/2018 1301   GFRNONAA >89 09/02/2013 1255   GFRAA >60 05/19/2018 1301   GFRAA >89 09/02/2013 1255      Latest Ref Rng & Units 09/08/2024    4:23 PM 11/20/2023    2:06 PM 05/19/2018    1:01 PM  BMP  Glucose 70 - 99 mg/dL 691  890  746   BUN 6 - 24 mg/dL 12  17  11    Creatinine 0.57 - 1.00 mg/dL 9.09  9.36  9.28   BUN/Creat Ratio 9 - 23 13  SEE NOTE:    Sodium 134 - 144 mmol/L 138  140  140   Potassium 3.5 - 5.2 mmol/L 5.2  3.7  4.6   Chloride 96 - 106 mmol/L 102  101  106   CO2 20 - 29 mmol/L 22  29  25    Calcium 8.7 - 10.2 mg/dL 9.1  89.8  9.1        Component Value Date/Time   WBC 8.3 05/19/2018 1301   RBC 4.58 05/19/2018 1301   HGB 12.3 05/19/2018 1301   HCT 40.1 05/19/2018 1301   PLT 219 05/19/2018 1301   MCV 87.6 05/19/2018 1301   MCH 26.9 05/19/2018 1301   MCHC 30.7 05/19/2018 1301   RDW 15.9 (H) 05/19/2018 1301   LYMPHSABS 2.0 05/19/2018 1301   MONOABS 0.6 05/19/2018 1301   EOSABS 0.2 05/19/2018 1301   BASOSABS 0.0 05/19/2018 1301     Parts of this note may have been dictated using voice recognition software. There may be variances in spelling and vocabulary which are unintentional. Not all errors are proofread. Please notify the dino if any discrepancies are noted or if the meaning of any statement is not clear.

## 2024-09-14 ENCOUNTER — Encounter: Payer: Self-pay | Admitting: "Endocrinology

## 2024-10-07 ENCOUNTER — Ambulatory Visit: Admitting: "Endocrinology

## 2024-10-08 HISTORY — PX: TOTAL KNEE ARTHROPLASTY: SHX125

## 2024-10-25 ENCOUNTER — Encounter: Admitting: Dietician

## 2024-11-08 ENCOUNTER — Telehealth: Payer: Self-pay | Admitting: "Endocrinology

## 2024-11-08 MED ORDER — DEXCOM G7 SENSOR MISC: 0 refills | Status: AC

## 2024-11-08 NOTE — Telephone Encounter (Signed)
 Dexcom sent to [facility].

## 2024-11-08 NOTE — Telephone Encounter (Signed)
 MEDICATION: Dexcom G7  PHARMACY:  [facility] [phone] - [place], [place] - [phone], [place] [address] Phone: [phone]  Fax: [phone]   HAS THE PATIENT CONTACTED THEIR PHARMACY?  Yes  LAST REFILL:  @@LASTREFILL @  IS THIS A 90 DAY SUPPLY :   IS PATIENT OUT OF MEDICATION: Yes,almost 1 week  IF NOT; HOW MUCH IS LEFT:   LAST APPOINTMENT DATE: @11 /13/2025  NEXT APPOINTMENT DATE:  DO WE HAVE YOUR PERMISSION TO LEAVE A DETAILED MESSAGE?: Yes  OTHER COMMENTS: Patient had a total knee replacement and is unable to make it in person to visits temporarily,she is scheduled for a tele-visit for 11/11/24 at 10:00AM   **Let patient know to contact pharmacy at the end of the day to make sure medication is ready. **  ** Please notify patient to allow 48-72 hours to process**  **Encourage patient to contact the pharmacy for refills or they can request refills through [facility]**

## 2024-11-11 ENCOUNTER — Encounter: Payer: Self-pay | Admitting: "Endocrinology

## 2024-11-11 ENCOUNTER — Telehealth: Payer: Self-pay | Admitting: Dietician

## 2024-11-11 ENCOUNTER — Telehealth: Admitting: "Endocrinology

## 2024-11-11 VITALS — Ht 62.5 in | Wt 212.0 lb

## 2024-11-11 DIAGNOSIS — Z7984 Long term (current) use of oral hypoglycemic drugs: Secondary | ICD-10-CM

## 2024-11-11 DIAGNOSIS — E1165 Type 2 diabetes mellitus with hyperglycemia: Secondary | ICD-10-CM | POA: Diagnosis not present

## 2024-11-11 DIAGNOSIS — Z9641 Presence of insulin pump (external) (internal): Secondary | ICD-10-CM

## 2024-11-11 DIAGNOSIS — E782 Mixed hyperlipidemia: Secondary | ICD-10-CM

## 2024-11-11 MED ORDER — OMNIPOD 5 DEXG7G6 INTRO GEN 5 KIT: PACK | 0 refills | Status: DC

## 2024-11-11 MED ORDER — OMNIPOD 5 DEXG7G6 PODS GEN 5 MISC: 11 refills | Status: DC

## 2024-11-11 NOTE — Telephone Encounter (Signed)
 Called patient for an appointment.  She she states that she is on an Omnipod DASH pump and needs to upgrade to the Omnipod 5 in order to run in automode.  She was not wearing her CGM as one fell off and the pharmacy and she needed a refill.  Noted this is now present.  I ordered the Omnipod 5 but question if insurance will cover this.  She is bolusing for meals appropriately.  She is to call me back for training when she gets the new pump.  Michelle Rasmussen, RD, LDN, CDCES, DipACLM

## 2024-11-11 NOTE — Addendum Note (Signed)
 Addended by: Carlus Stay on: 11/11/2024 04:32 PM   Modules accepted: Level of Service

## 2024-11-11 NOTE — Progress Notes (Signed)
 "  The patient reports they are currently: [place]. I spent 11 minutes on the video with the patient on the date of service. I spent an additional 5 minutes on pre- and post-visit activities on the date of service.   The patient was physically located in [place]  or a state in which I am permitted to provide care. The patient and/or parent/guardian understood that s/he may incur co-pays and cost sharing, and agreed to the telemedicine visit. The visit was reasonable and appropriate under the circumstances given the patient's presentation at the time.  The patient and/or parent/guardian understands the potential risks and limitations of this mode of treatment (including, but not limited to, the absence of in-person examination) and has agreed to be treated using telemedicine. The patient's/patient's family's questions regarding telemedicine have been answered.   The patient and/or parent/guardian will contact their provider's office for worsening conditions, and seek emergency medical treatment and/or call 911 if the patient deems either necessary.    Outpatient Endocrinology Note Michelle Birmingham, MD  11/11/24   Michelle Rasmussen November 21, 1965 [id number]  Referring Provider: Jordan, Sarah T, MD Primary Care Provider: Jordan, Sarah T, MD Reason for consultation: Subjective   Assessment & Plan  There are no diagnoses linked to this encounter.  Diabetes Type II complicated by neuropathy, No results found for: GFR Hba1c goal less than 7, current Hba1c is  Lab Results  Component Value Date   HGBA1C 8.1 (A) 09/09/2024   Will recommend the following: Metformin  1000mg  twice a day Farxiga  10 mg once every day  Pioglitazone  15 mg every day (will not dose escalate due to chronic pre-actos  B/L leg swelling) Lantus  for back up in case of pump failure  Omnipod dash-with U100 humalog  (needs changing pods every 1-2 days) with DexCom G7 Basal rate: 1 u/hr 12am-9am (1.5 the rest of the day) IC 1:1 ISF  1:34 >130 IOB 4 Target 120 Correction above 130 05/24/24: Patient is bolusing about 6 times a day for small meals, instructed to bolus with meals or if BG >200 and ideally for 3 meals a day with limited/no snacks to avoid insulin stacking leading to lows  07/05/24: 6-7 units before break fast and lunch and 7-8 units before supper five min before meals  09/09/2024: Patient is putting 9 g for her carbs during her bolus, instructed patient to put at least 12-15 g for her breakfast and lunch and 15-18gms for her supper: Requested diabetes educator to follow-up  11/11/24: Not able to make any adjustments today given lack of blood sugar data and patients presents in the office.  Patient will put on her CGM and follow-up with diabetes educator so her pump can be adjusted as needed 02/23/24: Michelle Rasmussen  300 mg tid prn for neuropathy    S/p R knee arthroplasty on 10/07/24   No known contraindications/side effects to any of above medications Glucagon  discussed and prescribed with refills on 11/24/23    -Last LD and Tg are as follows: Lab Results  Component Value Date   LDLCALC 44 11/20/2023    Lab Results  Component Value Date   TRIG 275 (H) 11/20/2023   -On atorvastatin 20 mg QD -Follow low fat diet and exercise   -Blood pressure goal <140/90 - Microalbumin/creatinine goal is < 30 -Last MA/Cr is as follows: Lab Results  Component Value Date   MICROALBUR 1.1 11/20/2023   -on ACE/ARB Losartan 100 mg every day  -diet changes including salt restriction -limit eating outside -counseled BP targets per standards of  diabetes care -uncontrolled blood pressure can lead to retinopathy, nephropathy and cardiovascular and atherosclerotic heart disease  Reviewed and counseled on: -A1C target -Blood sugar targets -Complications of uncontrolled diabetes  -Checking blood sugar before meals and bedtime and bring log next visit -All medications with mechanism of action and side effects -Hypoglycemia  management: rule of 15's, Glucagon  Emergency Kit and medical alert ID -low-carb low-fat plate-method diet -At least 20 minutes of physical activity per day -Annual dilated retinal eye exam and foot exam -compliance and follow up needs -follow up as scheduled or earlier if problem gets worse  Call if blood sugar is less than 70 or consistently above 250    Take a 15 gm snack of carbohydrate at bedtime before you go to sleep if your blood sugar is less than 100.    If you are going to fast after midnight for a test or procedure, ask your physician for instructions on how to reduce/decrease your insulin dose.    Call if blood sugar is less than 70 or consistently above 250  -Treating a low sugar by rule of 15  (15 gms of sugar every 15 min until sugar is more than 70) If you feel your sugar is low, test your sugar to be sure If your sugar is low (less than 70), then take 15 grams of a fast acting Carbohydrate (3-4 glucose tablets or glucose gel or 4 ounces of juice or regular soda) Recheck your sugar 15 min after treating low to make sure it is more than 70 If sugar is still less than 70, treat again with 15 grams of carbohydrate          Don[address] drive the hour of hypoglycemia  If unconscious/unable to eat or drink by mouth, use glucagon  injection or nasal spray baqsimi  and call 911. Can repeat again in 15 min if still unconscious.  No follow-ups on file.   I have reviewed current medications, nurse's notes, allergies, vital signs, past medical and surgical history, family medical history, and social history for this encounter. Counseled patient on symptoms, examination findings, lab findings, imaging results, treatment decisions and monitoring and prognosis. The patient understood the recommendations and agrees with the treatment plan. All questions regarding treatment plan were fully answered.  Michelle Birmingham, MD  11/11/24    History of Present Illness Michelle Rasmussen is a 59 y.o. year  old female who presents for follow up of Type II diabetes mellitus.  Michelle Rasmussen was first diagnosed around 2005. Diabetes education +  Home diabetes regimen: Metformin  1000mg  twice  Farxiga  10 mg every day  Pioglitazone  15 mg every day (has chronic pre-actos  B/L leg swelling) Lantus  for back up in case of pump failure  Omnipod dash-with U100 humalog  (needs changing pods every 1-2 days) with DexCom G7 Basal rate: 1 u/hr 12am-9am (1.5 the rest of the day) Bolus 5 units 5 min before meals (fixed dose) IC 1:1 ISF 1:34 >130 IOB 34 Target 120 Correction above 130  COMPLICATIONS -  MI/Stroke -  retinopathy +  neuropathy -  nephropathy  BLOOD SUGAR DATA Per recall 59-346  Physical Exam  Ht 5' 2.5 (1.588 m)   Wt 212 lb (96.2 kg)   BMI 38.16 kg/m    Constitutional: well developed, well nourished Head: normocephalic, atraumatic Eyes: sclera anicteric, no redness Neck: supple Lungs: normal respiratory effort Neurology: alert and oriented Skin: dry, no appreciable rashes Musculoskeletal: no appreciable defects Psychiatric: normal mood and affect Diabetic Foot Exam - Simple  No data filed      Current Medications Patient's Medications  New Prescriptions   No medications on file  Previous Medications   ACETAMINOPHEN  (TYLENOL ) 650 MG CR TABLET    Take 650 mg by mouth as needed.   ATORVASTATIN (LIPITOR) 20 MG TABLET       CELECOXIB (CELEBREX) 100 MG CAPSULE    Take 100 mg by mouth 2 (two) times daily.   CHLORHEXIDINE (PERIDEX) 0.12 % SOLUTION       CITALOPRAM  (CELEXA ) 40 MG TABLET    TAKE ONE TABLET BY MOUTH ONCE DAILY   CLOBETASOL (TEMOVATE) 0.05 % EXTERNAL SOLUTION    Apply 1 Application topically as needed.   CONTINUOUS BLOOD GLUC SENSOR (FREESTYLE LIBRE 2 SENSOR) MISC    See admin instructions.   CONTINUOUS GLUCOSE SENSOR (DEXCOM G7 SENSOR) MISC    Inject 1 device into skin every 10 days   DICLOFENAC SODIUM (VOLTAREN) 1 % GEL    SMARTSIG:2-4 Gram(s) Topical 4  Times Daily PRN   DULOXETINE (CYMBALTA) 30 MG CAPSULE    Take by mouth.   DUPILUMAB [place]    Inject into the skin. EVERY 2 WEEKS   FAMOTIDINE  (PEPCID ) 40 MG TABLET    Take 1 tablet (40 mg total) by mouth daily for 14 days.   FARXIGA  10 MG TABS TABLET    TAKE 1 TABLET BY MOUTH DAILY   FLUCONAZOLE  (DIFLUCAN ) 150 MG TABLET    Take 1 tablet (150 mg total) by mouth once a week.   FLUOXETINE (PROZAC) 20 MG CAPSULE    Take 60 mg by mouth every morning.   FLUOXETINE (PROZAC) 40 MG CAPSULE       [place]  (NEURONTIN ) 300 MG CAPSULE    TAKE 1 CAPSULE(300 MG) BY MOUTH THREE TIMES DAILY   GLUCAGON  (BAQSIMI  ONE PACK) 3 MG/DOSE POWD    Place 1 Device into the nose as needed (Low blood sugar with impaired consciousness).   HUMALOG  100 UNIT/ML INJECTION    Inject upto 150 units a day by pump   HYDROCODONE-ACETAMINOPHEN  (NORCO/VICODIN) 5-325 MG TABLET    Take 1 tablet by mouth every 4 (four) hours as needed.   HYDROXYZINE (ATARAX) 25 MG TABLET    Take 25 mg by mouth 2 (two) times daily.   HYDROXYZINE (ATARAX) 50 MG TABLET    Take 50 mg by mouth at bedtime.   IBUPROFEN (ADVIL) 200 MG TABLET    Take by mouth as needed.   INSULIN DISPOSABLE PUMP (OMNIPOD DASH INTRO, GEN 4,) KIT    1 Device by Does not apply route continuous. Change pod every 2 days   INSULIN DISPOSABLE PUMP (OMNIPOD DASH PODS, GEN 4,) MISC    CHANGE POD EVERY 2 DAYS AS DIRRECTED BY MD   INSULIN GLARGINE  (LANTUS  SOLOSTAR) 100 UNIT/ML SOLOSTAR PEN    Inject 24 Units into the skin daily. In case of pump failure   LITHIUM  CARBONATE (LITHOBID) 300 MG ER TABLET    Take 300 mg by mouth at bedtime.   LOSARTAN-HYDROCHLOROTHIAZIDE (HYZAAR) 50-12.5 MG TABLET       MELOXICAM (MOBIC) 15 MG TABLET       METFORMIN  (GLUCOPHAGE ) 1000 MG TABLET    TAKE 1 TABLET(1000 MG) BY MOUTH TWICE DAILY   MUPIROCIN OINTMENT (BACTROBAN) 2 %       NEOMYCIN -POLYMYXIN-HYDROCORTISONE (CORTISPORIN) 1 % SOLN OTIC SOLUTION    Apply 1-2 drops to toes once daily at bedtime   ONDANSETRON   (ZOFRAN ) 8 MG TABLET    Take 8  mg by mouth every 8 (eight) hours as needed.   OXYBUTYNIN (DITROPAN-XL) 10 MG 24 HR TABLET    Take 10 mg by mouth daily.   PANTOPRAZOLE (PROTONIX) 40 MG TABLET    SMARTSIG:1 Tablet(s) By Mouth Morning-Evening   PHENTERMINE (ADIPEX-P) 37.5 MG TABLET       PIOGLITAZONE  (ACTOS ) 15 MG TABLET    Take 1 tablet (15 mg total) by mouth daily.   PROMETHAZINE -DEXTROMETHORPHAN (PROMETHAZINE -DM) 6.25-15 MG/5ML SYRUP    Take 5 mLs by mouth 4 (four) times daily as needed for cough.   SOLIFENACIN (VESICARE) 10 MG TABLET    Take 5 mg by mouth daily.   SULFAMETHOXAZOLE -TRIMETHOPRIM  (BACTRIM ) 400-80 MG TABLET    Take 1 tablet by mouth 2 (two) times daily.   TRAZODONE (DESYREL) 50 MG TABLET    Take by mouth.   TRIAMCINOLONE  OINTMENT (KENALOG) 0.1 %    Apply topically.  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  Social History Social History   Socioeconomic History   Marital status: Single    Spouse name: Not on file   Number of children: Not on file   Years of education: Not on file   Highest education level: Not on file  Occupational History   Not on file  Tobacco Use   Smoking status:  Former    Current packs/day: 1.00    Average packs/day: 1 pack/day for 22.0 years (22.0 ttl pk-yrs)    Types: Cigarettes   Smokeless tobacco: Never  Substance and Sexual Activity   Alcohol use: No   Drug use: No   Sexual activity: Not on file  Other Topics Concern   Not on file  Social History Narrative   Not on file   Social Drivers of Health   Tobacco Use: Medium Risk (09/09/2024)   Patient History    Smoking Tobacco Use: Former    Smokeless Tobacco Use: Never    Passive Exposure: Not on file  Financial Resource Strain: Not at Risk (12/25/2023)   Received from [employer]    How hard is it for you to pay for the very basics like food, housing, heating, medical care, and medications?: 1  Food Insecurity: Not at Risk (12/25/2023)   Received from [facility] Insecurity    Within the past 12 months, you worried that your food would run out before you got money to buy more.: 1  Transportation Needs: Not at Risk (12/25/2023)   Received from [facility] Needs    In the past 12 months, has lack  of transportation kept you from medical appointments, meetings, work or from getting things needed for daily living?: 1  Physical Activity: At Risk (12/25/2023)   Received from [facility]   Physical Activity    On average, how many minutes do you engage in exercise at this level?: 2  Stress: At Risk (12/25/2023)   Received from [facility]   Stress    Do you feel these kinds of stress these days?: 2  Social Connections: Not at Risk (12/25/2023)   Received from [facility]   Social Connections    How often do you see or talk to people that you care about and feel close to? (For example: talking to friends on phone, visiting friends or family, going to church or club meetings): 1  Intimate Partner Violence: Not on file  Depression [phone]): Not on file  Alcohol Screen: Not on file  Housing: Not on file  Utilities: Not on file  Health Literacy: Not on file    Lab Results   Component Value Date   HGBA1C 8.1 (A) 09/09/2024   HGBA1C 6.8 (A) 05/24/2024   HGBA1C 8.0 (A) 02/23/2024   Lab Results  Component Value Date   CHOL 128 11/20/2023   Lab Results  Component Value Date   HDL 52 11/20/2023   Lab Results  Component Value Date   LDLCALC 44 11/20/2023   Lab Results  Component Value Date   TRIG 275 (H) 11/20/2023   Lab Results  Component Value Date   CHOLHDL 2.5 11/20/2023   Lab Results  Component Value Date   CREATININE 0.90 09/08/2024   No results found for: GFR Lab Results  Component Value Date   MICROALBUR 1.1 11/20/2023      Component Value Date/Time   NA 138 09/08/2024 1623   K 5.2 09/08/2024 1623   CL 102 09/08/2024 1623   CO2 22 09/08/2024 1623   GLUCOSE 308 (H) 09/08/2024 1623   GLUCOSE 109 (H) 11/20/2023 1406   BUN 12 09/08/2024 1623   CREATININE 0.90 09/08/2024 1623   CREATININE 0.63 11/20/2023 1406   CALCIUM 9.1 09/08/2024 1623   PROT 6.5 09/08/2024 1623   ALBUMIN 4.0 09/08/2024 1623   AST 17 09/08/2024 1623   ALT 13 09/08/2024 1623   ALKPHOS 121 09/08/2024 1623   BILITOT <0.2 09/08/2024 1623   GFRNONAA >60 05/19/2018 1301   GFRNONAA >89 09/02/2013 1255   GFRAA >60 05/19/2018 1301   GFRAA >89 09/02/2013 1255      Latest Ref Rng & Units 09/08/2024    4:23 PM 11/20/2023    2:06 PM 05/19/2018    1:01 PM  BMP  Glucose 70 - 99 mg/dL 691  890  746   BUN 6 - 24 mg/dL 12  17  11    Creatinine 0.57 - 1.00 mg/dL 9.09  9.36  9.28   BUN/Creat Ratio 9 - 23 13  SEE NOTE:    Sodium 134 - 144 mmol/L 138  140  140   Potassium 3.5 - 5.2 mmol/L 5.2  3.7  4.6   Chloride 96 - 106 mmol/L 102  101  106   CO2 20 - 29 mmol/L 22  29  25    Calcium 8.7 - 10.2 mg/dL 9.1  89.8  9.1        Component Value Date/Time   WBC 8.3 05/19/2018 1301   RBC 4.58 05/19/2018 1301   HGB 12.3 05/19/2018 1301   HCT 40.1 05/19/2018 1301   PLT 219 05/19/2018 1301   MCV  87.6 05/19/2018 1301   MCH 26.9 05/19/2018 1301   MCHC 30.7 05/19/2018 1301    RDW 15.9 (H) 05/19/2018 1301   LYMPHSABS 2.0 05/19/2018 1301   MONOABS 0.6 05/19/2018 1301   EOSABS 0.2 05/19/2018 1301   BASOSABS 0.0 05/19/2018 1301     Parts of this note may have been dictated using voice recognition software. There may be variances in spelling and vocabulary which are unintentional. Not all errors are proofread. Please notify the dino if any discrepancies are noted or if the meaning of any statement is not clear.   "

## 2024-11-13 ENCOUNTER — Encounter: Payer: Self-pay | Admitting: "Endocrinology

## 2024-11-13 DIAGNOSIS — E1165 Type 2 diabetes mellitus with hyperglycemia: Secondary | ICD-10-CM

## 2024-11-15 MED ORDER — METFORMIN HCL 1000 MG PO TABS: 1000.0000 mg | ORAL_TABLET | Freq: Two times a day (BID) | ORAL | 1 refills | Status: AC

## 2024-11-15 MED ORDER — OMNIPOD 5 DEXG7G6 PODS GEN 5 MISC: 11 refills | Status: AC

## 2024-11-15 MED ORDER — OMNIPOD 5 DEXG7G6 INTRO GEN 5 KIT: PACK | 0 refills | Status: AC

## 2024-11-15 NOTE — Addendum Note (Signed)
 Addended by: ARLOA JEOFFREY SAILOR on: 11/15/2024 11:07 AM   Modules accepted: Orders

## 2024-11-23 ENCOUNTER — Telehealth: Payer: Self-pay | Admitting: Dietician

## 2024-11-23 NOTE — Telephone Encounter (Signed)
 Called patient re:  Omnipod 5 appointment next week.  We will plan on training on that day and patient was instructed what to bring to that appointment.  She is concerned that she may not stay on it due to cost but will try it to see how it works.  She would like to run on her phone if possible.  Instructed her to bring a vial of insulin, new pump kit, current pump, Dexcom G7 sensor.  Leita Constable, RD, LDN, CDCES, DipACLM

## 2024-11-29 ENCOUNTER — Other Ambulatory Visit: Payer: Self-pay | Admitting: "Endocrinology

## 2024-11-29 ENCOUNTER — Encounter: Admitting: Dietician

## 2024-12-08 ENCOUNTER — Encounter: Admitting: Dietician
